# Patient Record
Sex: Male | Born: 1980 | Hispanic: Yes | Marital: Married | Smoking: Never smoker
Health system: Southern US, Community
[De-identification: ages and names within clinical notes are randomized; demographics above are authoritative.]

## PROBLEM LIST (undated history)

## (undated) DIAGNOSIS — I10 Essential (primary) hypertension: Secondary | ICD-10-CM

## (undated) DIAGNOSIS — E785 Hyperlipidemia, unspecified: Secondary | ICD-10-CM

## (undated) HISTORY — DX: Essential (primary) hypertension: I10

## (undated) HISTORY — DX: Hyperlipidemia, unspecified: E78.5

---

## 2014-02-11 ENCOUNTER — Emergency Department (HOSPITAL_COMMUNITY): Payer: Self-pay

## 2014-02-11 ENCOUNTER — Encounter (HOSPITAL_COMMUNITY): Payer: Self-pay | Admitting: Emergency Medicine

## 2014-02-11 ENCOUNTER — Emergency Department (HOSPITAL_COMMUNITY)
Admission: EM | Admit: 2014-02-11 | Discharge: 2014-02-11 | Disposition: A | Discharge status: Home / Self Care | Payer: Self-pay | Attending: Emergency Medicine | Admitting: Emergency Medicine

## 2014-02-11 DIAGNOSIS — R0789 Other chest pain: Secondary | ICD-10-CM | POA: Insufficient documentation

## 2014-02-11 DIAGNOSIS — I10 Essential (primary) hypertension: Secondary | ICD-10-CM | POA: Insufficient documentation

## 2014-02-11 DIAGNOSIS — R0602 Shortness of breath: Secondary | ICD-10-CM | POA: Insufficient documentation

## 2014-02-11 LAB — CBC
HEMATOCRIT: 42.3 % (ref 39.0–52.0)
Hemoglobin: 13.9 g/dL (ref 13.0–17.0)
MCH: 26.9 pg (ref 26.0–34.0)
MCHC: 32.9 g/dL (ref 30.0–36.0)
MCV: 82 fL (ref 78.0–100.0)
Platelets: 240 10*3/uL (ref 150–400)
RBC: 5.16 MIL/uL (ref 4.22–5.81)
RDW: 15.4 % (ref 11.5–15.5)
WBC: 6.1 10*3/uL (ref 4.0–10.5)

## 2014-02-11 LAB — COMPREHENSIVE METABOLIC PANEL
ALT: 27 U/L (ref 0–53)
ANION GAP: 12 (ref 5–15)
AST: 17 U/L (ref 0–37)
Albumin: 4.5 g/dL (ref 3.5–5.2)
Alkaline Phosphatase: 46 U/L (ref 39–117)
BILIRUBIN TOTAL: 0.4 mg/dL (ref 0.3–1.2)
BUN: 11 mg/dL (ref 6–23)
CHLORIDE: 99 meq/L (ref 96–112)
CO2: 27 meq/L (ref 19–32)
Calcium: 9.3 mg/dL (ref 8.4–10.5)
Creatinine, Ser: 0.91 mg/dL (ref 0.50–1.35)
Glucose, Bld: 87 mg/dL (ref 70–99)
Potassium: 4.6 mEq/L (ref 3.7–5.3)
Sodium: 138 mEq/L (ref 137–147)
Total Protein: 8.1 g/dL (ref 6.0–8.3)

## 2014-02-11 LAB — I-STAT TROPONIN, ED
Troponin i, poc: 0.05 ng/mL (ref 0.00–0.08)
Troponin i, poc: 0.04 ng/mL (ref 0.00–0.08)

## 2014-02-11 MED ORDER — PANTOPRAZOLE SODIUM 40 MG PO TBEC
40.0000 mg | DELAYED_RELEASE_TABLET | Freq: Every day | ORAL | Status: DC
  Administered 2014-02-11: 40 mg via ORAL
  Filled 2014-02-11: qty 1

## 2014-02-11 NOTE — ED Notes (Signed)
Pt monitored by pulse ox, bp cuff, and 12-lead. 

## 2014-02-11 NOTE — ED Notes (Signed)
Chest pain x 1 month and  Some sob  Pt speaks  Little english

## 2014-02-11 NOTE — ED Provider Notes (Signed)
CSN: [id number]     Arrival date & time 02/11/14  1358 History   First MD Initiated Contact with Patient 02/11/14 1619     Chief Complaint  Patient presents with  . Chest Pain  . Hypertension     (Consider location/radiation/quality/duration/timing/severity/associated sxs/prior Treatment) HPI Mort SawyersSalvador Derrell LollingRamirez is a 33 y.o. male presenting with mid chest pain intermittently for one month worse after eating. Not with deep breathing or activity. Pain does not radiate. Patient notes mild SOB with chest pain. Not associated with diaphoresis, nausea, vomiting.  Patient does not describe the pain as pressure, burning, sharp and cannot describe. Patient resolved. Patient found to be hypertensive to 169/107. Patient has never seen a PCP and unaware of hypercholesterolemia, DM. Patient does not have FH of sudden cardiac death. Patient does not smoke and is obese. No hemoptysis, recent surgery or trauma. No unilateral leg swelling or h/o DVT, PE.  Patient with friend in room who was translating with permission of patient.    History reviewed. No pertinent past medical history. History reviewed. No pertinent past surgical history. No family history on file. History  Substance Use Topics  . Smoking status: Never Smoker   . Smokeless tobacco: Not on file  . Alcohol Use: Yes    Review of Systems  Constitutional: Negative for fever and chills.  HENT: Negative for congestion and rhinorrhea.   Eyes: Negative for visual disturbance.  Respiratory: Positive for shortness of breath. Negative for cough.   Cardiovascular: Positive for chest pain. Negative for palpitations and leg swelling.  Gastrointestinal: Negative for nausea, vomiting and diarrhea.  Genitourinary: Negative for dysuria and hematuria.  Musculoskeletal: Negative for back pain and gait problem.  Skin: Negative for rash.  Neurological: Negative for weakness and headaches.      Allergies  Review of patient's allergies indicates no  known allergies.  Home Medications   Prior to Admission medications   Not on File   BP 134/76  Pulse 80  Temp(Src) 98 F (36.7 C) (Oral)  Resp 23  SpO2 97% Physical Exam  Nursing note and vitals reviewed. Constitutional: He appears well-developed and well-nourished. No distress.  HENT:  Head: Normocephalic and atraumatic.  Eyes: Conjunctivae and EOM are normal. Right eye exhibits no discharge. Left eye exhibits no discharge.  Cardiovascular: Normal rate, regular rhythm and normal heart sounds.   No leg swelling or tenderness. Negative Homan's sign.  Pulmonary/Chest: Effort normal and breath sounds normal. No respiratory distress. He has no wheezes.  Abdominal: Soft. Bowel sounds are normal. He exhibits no distension. There is no tenderness.  Musculoskeletal:  No midline back tenderness, step off or crepitus. No Right or Left sided lower back tenderness.   Neurological: He is alert. He exhibits normal muscle tone. Coordination normal.  Skin: Skin is warm and dry. He is not diaphoretic.    ED Course  Procedures (including critical care time) Labs Review Labs Reviewed  COMPREHENSIVE METABOLIC PANEL  CBC  I-STAT TROPOININ, ED  Rosezena SensorI-STAT TROPOININ, ED    Imaging Review Dg Chest 2 View  02/11/2014   CLINICAL DATA:  Chest pain.  EXAM: CHEST  2 VIEW  COMPARISON:  None.  FINDINGS: The heart size and mediastinal contours are within normal limits. Both lungs are clear. No pneumothorax or pleural effusion is noted. The visualized skeletal structures are unremarkable.  IMPRESSION: No acute cardiopulmonary abnormality seen.   Electronically Signed   By: Roque LiasJames  Green M.D.   On: 02/11/2014 15:33     EKG Interpretation  Date/Time:  Monday February 11 2014 14:19:23 EDT Ventricular Rate:  80 PR Interval:  150 QRS Duration: 94 QT Interval:  372 QTC Calculation: 429 R Axis:   1 Text Interpretation:  Normal sinus rhythm Moderate voltage criteria for  LVH, may be normal variant Inferior  infarct , age undetermined T wave  abnormality, consider lateral ischemia No previous ECGs available  Confirmed by [facility]  MD, Jonny Ruiz ([id number]) on 02/11/2014 5:37:39 PM      MDM   Final diagnoses:  Essential hypertension  Other chest pain   Chest pain is not likely of cardiac or pulmonary etiology d/t presentation, PERC negative, low risk HEART score. VSS, no JVD or new murmur, RRR, breath sounds equal bilaterally, EKG with t wave abnormalities and LVH, negative troponin and delta troponin, and negative CXR. Pt has been advised start a PPI and return to the ED if CP becomes exertional, associated with diaphoresis or nausea, radiates to left jaw/arm, worsens or becomes concerning in any way. Patient with hypertension.   Discussed that this most likely has been high for a while now. BP decreased in ED. Patient to follow up with cardiology due to his abnormal EKG. Patient without a PCP. Patient referred to the wellness center for further management of HTN. Patient to establish care and follow up. ED resources provided.   Discussed return precautions with patient. Discussed all results and patient verbalizes understanding and agrees with plan.  Case has been discussed with Dr. Fonnie Jarvis who agrees with the above plan and to discharge.     Louann Sjogren, PA-C 02/12/14 0110

## 2014-02-11 NOTE — Discharge Instructions (Signed)
Return to the emergency room with worsening of symptoms, new symptoms or with symptoms that are concerning, especially chest pain that feels like a pressure, spreads to left arm or jaw, worse with exertion, associated with nausea, vomiting, shortness of breath and/or sweating.  Start taking PPI. Over the counter omeprazole 20mg  once a day. Take one hour before meal. Call to make appointment with cardiology, heart doctor for further work up because of some abnormalities on your EKG, heart tracing. Call to make appointment with [facility]. Number above for management of your high blood pressure or Hypertension.

## 2014-02-11 NOTE — ED Notes (Signed)
Pt c/o pain when he eats especially pizza, rice, chicken cheese. SOB when he eats a lot.

## 2014-02-22 NOTE — ED Provider Notes (Signed)
Medical screening examination/treatment/procedure(s) were performed by non-physician practitioner and as supervising physician I was immediately available for consultation/collaboration.   EKG Interpretation   Date/Time:  Monday February 11 2014 14:19:23 EDT Ventricular Rate:  80 PR Interval:  150 QRS Duration: 94 QT Interval:  372 QTC Calculation: 429 R Axis:   1 Text Interpretation:  Normal sinus rhythm Moderate voltage criteria for  LVH, may be normal variant Inferior infarct , age undetermined T wave  abnormality, consider lateral ischemia No previous ECGs available  Confirmed by [facility]  MD, Jonny Ruiz ([id number]) on 02/11/2014 5:37:39 PM       Hurman Horn, MD 02/22/14 [phone]

## 2014-09-09 ENCOUNTER — Ambulatory Visit (INDEPENDENT_AMBULATORY_CARE_PROVIDER_SITE_OTHER): Payer: Self-pay | Admitting: Internal Medicine

## 2014-09-09 ENCOUNTER — Ambulatory Visit (INDEPENDENT_AMBULATORY_CARE_PROVIDER_SITE_OTHER): Payer: Self-pay

## 2014-09-09 VITALS — BP 160/100 | HR 75 | Temp 98.0°F | Resp 16 | Ht 68.5 in | Wt 240.0 lb

## 2014-09-09 DIAGNOSIS — M1000 Idiopathic gout, unspecified site: Secondary | ICD-10-CM

## 2014-09-09 DIAGNOSIS — M79671 Pain in right foot: Secondary | ICD-10-CM

## 2014-09-09 LAB — URIC ACID: Uric Acid, Serum: 7.4 mg/dL (ref 4.0–7.8)

## 2014-09-09 LAB — POCT CBC
Granulocyte percent: 66.5 %G (ref 37–80)
HEMATOCRIT: 40.4 % — AB (ref 43.5–53.7)
Hemoglobin: 13.1 g/dL — AB (ref 14.1–18.1)
LYMPH, POC: 1.5 (ref 0.6–3.4)
MCH, POC: 25.7 pg — AB (ref 27–31.2)
MCHC: 32.4 g/dL (ref 31.8–35.4)
MCV: 79.5 fL — AB (ref 80–97)
MID (cbc): 0.3 (ref 0–0.9)
MPV: 6.1 fL (ref 0–99.8)
POC Granulocyte: 3.6 (ref 2–6.9)
POC LYMPH PERCENT: 28 %L (ref 10–50)
POC MID %: 5.5 % (ref 0–12)
Platelet Count, POC: 318 10*3/uL (ref 142–424)
RBC: 5.09 M/uL (ref 4.69–6.13)
RDW, POC: 18 %
WBC: 5.4 10*3/uL (ref 4.6–10.2)

## 2014-09-09 LAB — POCT SEDIMENTATION RATE: POCT SED RATE: 24 mm/hr — AB (ref 0–22)

## 2014-09-09 MED ORDER — INDOMETHACIN 50 MG PO CAPS: 50.0000 mg | ORAL_CAPSULE | Freq: Three times a day (TID) | ORAL | Status: DC

## 2014-09-09 NOTE — Progress Notes (Signed)
   Subjective:    Patient ID: [name] [name], male    DOB: 27-Nov-1980, 34 y.o.   MRN: [id number]  HPI 2 days of right foot pain with no injury.No injury at work, No fever, No pain with moving only when walking. Pain mostly on top of foot  Review of Systems     Objective:   Physical Exam  Constitutional: He is oriented to person, place, and time. He appears well-developed and well-nourished. He appears distressed.  HENT:  Head: Normocephalic.  Mouth/Throat: Oropharynx is clear and moist.  Eyes: EOM are normal. No scleral icterus.  Neck: Normal range of motion.  Cardiovascular: Normal rate.   Pulmonary/Chest: Effort normal.  Musculoskeletal:       Right foot: There is tenderness and bony tenderness. There is normal range of motion, no swelling, normal capillary refill, no crepitus, no deformity and no laceration.       Feet:  Mild erythema  Neurological: He is alert and oriented to person, place, and time. He has normal strength. No sensory deficit. He exhibits normal muscle tone. Gait abnormal. Coordination normal.  Skin: No rash noted. There is erythema.  Psychiatric: He has a normal mood and affect.  Vitals reviewed.    [facility] reading (PRIMARY) by  Dr.Kalim Kissel normal foot xray Results for orders placed or performed in visit on 09/09/14  POCT CBC  Result Value Ref Range   WBC 5.4 4.6 - 10.2 K/uL   Lymph, poc 1.5 0.6 - 3.4   POC LYMPH PERCENT 28.0 10 - 50 %L   MID (cbc) 0.3 0 - 0.9   POC MID % 5.5 0 - 12 %M   POC Granulocyte 3.6 2 - 6.9   Granulocyte percent 66.5 37 - 80 %G   RBC 5.09 4.69 - 6.13 M/uL   Hemoglobin 13.1 (A) 14.1 - 18.1 g/dL   HCT, POC 89.1 (A) 69.4 - 53.7 %   MCV 79.5 (A) 80 - 97 fL   MCH, POC 25.7 (A) 27 - 31.2 pg   MCHC 32.4 31.8 - 35.4 g/dL   RDW, POC 50.3 %   Platelet Count, POC 318 142 - 424 K/uL   MPV 6.1 0 - 99.8 fL     Cbc/sed rate/uric acid       Assessment & Plan:  Probable acute gouty arthritis Camwalker/Indocin

## 2014-09-09 NOTE — Patient Instructions (Signed)
Gota  °(Gout) ° La gota es una artritis inflamatoria originada por la acumulación de cristales de ácido úrico en las articulaciones. El ácido úrico es una sustancia química que normalmente se encuentra en la sangre. Cuando los niveles de ácido úrico en la sangre son muy elevados, pueden formarse cristales que se depositan en las articulaciones y los tejidos. Esto causa irritación, dolor e hinchazón (inflamación). La repetición de los ataques es frecuente. Con el tiempo, los cristales de ácido úrico pueden formar masas (tofos) cerca de una articulación, destruyen el hueso y provocan una deformidad La gota es una enfermedad que puede tratarse y con frecuencia puede prevenirse. °CAUSAS  °La enfermedad comienza con niveles altos de ácido úrico en la sangre. El organismo produce ácido úrico cuando metaboliza una sustancia que se encuentra en estado natural, denominada purina. Ciertos alimentos, como carnes y pescado, contienen grandes cantidades de purinas. Las causas de ácido úrico elevado son:  °· Ser transmitida de padres a hijos (hereditaria). °· Enfermedades que causan un aumento de la producción de ácido úrico (como la obesidad, la psoriasis y ciertos tipos de cáncer). °· Abuso en el consumo de bebidas alcohólicas. °· La dieta, especialmente las dietas en las que se consume mucha carne y frutos de mar. °· Ciertos medicamentos, como los que combaten el cáncer (quimioterapia), los diuréticos y la aspirina. °· Enfermedades renales crónicas. Los riñones no pueden eliminar bien el ácido úrico. °· Problemas con el metabolismo. °Las enfermedades fuertemente asociadas a la gota son:  °· Obesidad. °· Hipertensión arterial. °· Colesterol elevado. °· Diabetes. °No todas las personas con niveles elevados de ácido úrico sufren gota. No se comprende aún por qué algunas personas padecen gota y otras no. Las cirugías, lesiones en una articulación y el consumo excesivo de ciertos alimentos son algunos de los factores que pueden  provocar ataques de gota.  °SÍNTOMAS  °· Un ataque de gota puede comenzar rápidamente. Causa un dolor intenso, con irritación, hinchazón y calor en una articulación. °· Puede haber fiebre. °· Generalmente sólo una articulación es afectada. Ciertas articulaciones se ven implicadas con más frecuencia. °¨ La base del dedo gordo del pie. °¨ La rodilla. °¨ El tobillo. °¨ La muñeca. °¨ Un dedo. °Sin tratamiento, un ataque por lo general desaparece en unos pocos días o semanas. Entre un ataque y otro, por lo general no hay síntomas, lo cual es diferente de muchas otras formas de artritis.  °DIAGNÓSTICO  °El profesional reunirá la información basándose en los síntomas y el examen físico. En algunos casos indicará estudios. Estos estudios pueden ser:  °· Análisis de sangre. °· Análisis de orina. °· Radiografías. °· Análisis de los fluidos de la articulación. En este estudio se necesita una aguja para extraer líquido de la articulación (artrocentesis). Con el uso del microscopio, se confirma la gota cuando se observan los cristales de ácido úrico en el líquido de la articulación. °TRATAMIENTO  °Hay dos fases en el tratamiento de la gota: el tratamiento del ataque de aparición repentina (agudo) y la prevención de los ataques (profilaxis).  °· Tratamiento de un ataque agudo. °¨ Se utilizan medicamentos. Se indican antiinflamatorios o corticoides. °¨ En algunos casos es necesario inyectar un corticoide en la articulación afectada. °¨ La articulación dolorosa se pone en reposo. El movimiento puede empeorar la artritis. °¨ Puede utilizar tanto tratamientos con calor o con frío para aliviar el dolor en las articulaciones, según lo que le haga mejor. °· Tratamiento para prevenir ataques. °¨ Si sufre de ataques de gota frecuentes,   el médico puede recomendarle medicamentos preventivos. Estos medicamentos se administran después de que el ataque agudo mejora. Estos medicamentos ayudan a los riñones a eliminar el ácido úrico del  organismo o a disminuir la producción de ácido úrico. Es posible que deba utilizar estos medicamentos durante un largo tiempo. °¨ La primera fase del tratamiento preventiva puede asociarse con un aumento de los ataques agudos de gota. Por esta razón, durante los primeros meses de tratamiento, su médico puede también aconsejarle que tome medicamentos que habitualmente se utilizan para el tratamiento de la gota aguda. Asegúrese de comprender todas las indicaciones del médico. El médico podrá hacer varios ajustes a la dosis del medicamento antes de que comiencen a ser efectivos. °¨ Comente el tratamiento dietético con su médico o nutricionista. El alcohol y las bebidas que contienen gran cantidad de azúcar y fructosa y los alimentos como la carne, el pollo y los frutos de mar pueden aumentar los niveles de ácido úrico. El médico o el nutricionista podrá aconsejarlo sobre las bebidas y los alimentos que debe limitar. °INSTRUCCIONES PARA EL CUIDADO EN EL HOGAR  °· No tome aspirina para el dolor. Esto eleva los niveles de ácido úrico. °· Sólo tome medicamentos de venta libre o prescriptos para calmar el dolor, las molestias o bajar la fiebre según las indicaciones de su médico. °· Haga reposo todo el tiempo que pueda. Cuando se encuentre en la cama, mantenga las sábanas y mantas alejadas de las articulaciones doloridas. °· Mantenga la articulación afectada levantada (elevada). °· Aplique compresas frías o calientes sobre las articulaciones doloridas. el uso de compresas calientes o frías depende de lo que mejor le resulte a usted. °· Utilice muletas si la articulación que le duele es de la pierna. °· Debe ingerir gran cantidad de líquido para mantener la orina de tono claro o color amarillo pálido. Esto ayudará a que el organismo elimine el ácido úrico. Limite el consumo de alcohol, bebidas azucaradas y que contengan fructosa. °· Siga las indicaciones con respecto a la dieta. Preste especial atención a la cantidad de  proteínas que consume. En la dieta diaria debe enfatizar el consumo de frutas, vegetales, granos enteros y productos lácteos descremados. Comente con su médico o nutricionista acerca del consumo de café, vitamina C o cerezas. Estos pueden ayudar a disminuir los niveles de ácido úrico. °· Mantenga un peso corporal adecuado. °SOLICITE ATENCIÓN MÉDICA SI:  °· Tiene diarrea, vómitos o algún efecto secundario provocado por los medicamentos. °· No se siente mejor en 24 horas, o empeora. °SOLICITE ATENCIÓN MÉDICA DE INMEDIATO SI:  °· Las articulaciones le duelen más de manera repentina, tiene escalofríos o fiebre. °ASEGÚRESE DE QUE:  °· Comprende estas instrucciones. °· Controlará su enfermedad. °· Solicitará ayuda de inmediato si no mejora o si empeora. °Document Released: 01/20/2005 Document Revised: 08/07/2012 °ExitCare® Patient Information ©2015 [employer]. This information is not intended to replace advice given to you by your health care provider. Make sure you discuss any questions you have with your health care provider. ° °

## 2014-09-10 ENCOUNTER — Encounter: Payer: Self-pay | Admitting: Family Medicine

## 2015-10-18 ENCOUNTER — Ambulatory Visit (INDEPENDENT_AMBULATORY_CARE_PROVIDER_SITE_OTHER): Payer: Self-pay | Admitting: Urgent Care

## 2015-10-18 VITALS — BP 120/84 | HR 80 | Temp 98.0°F | Resp 18 | Ht 68.5 in | Wt 221.0 lb

## 2015-10-18 DIAGNOSIS — M79671 Pain in right foot: Secondary | ICD-10-CM

## 2015-10-18 MED ORDER — NAPROXEN SODIUM 550 MG PO TABS: 550.0000 mg | ORAL_TABLET | Freq: Two times a day (BID) | ORAL | Status: DC

## 2015-10-18 NOTE — Patient Instructions (Addendum)
Espoln calcneo (Heel Spur) Los espolones calcneos son protuberancias seas que se forman en la parte inferior del hueso del taln (calcneo). Son frecuentes y no siempre Customer service manager. Sin embargo, a menudo Photographer en la banda de tejido resistente que se extiende por debajo del hueso del pie (fascia plantar). Cuando esto sucede, puede sentir [employer] parte inferior del pie, cerca del taln.  CAUSAS  No se conoce con exactitud la causa de los espolones calcneos. Pueden deberse a la presin ejercida sobre el taln, o bien originarse en las inserciones musculares (tendones) cercanas que traccionan en el taln.  FACTORES DE RIESGO Puede estar en riesgo de tener un espoln calcneo si:  Tiene ms de 40 aos.  Tiene sobrepeso.  Padece artritis por uso y desgaste (artrosis).  Presenta inflamacin en la fascia plantar. SIGNOS Y SNTOMAS  Algunas personas tienen espolones calcneos aunque no presentan sntomas. Si tiene sntomas, estos pueden incluir los siguientes:   Dolor en la parte inferior del taln.  Dolor que empeora al levantarse de la cama por primera vez.  Dolor que empeora despus de caminar o ponerse de pie. DIAGNSTICO  El mdico podr hacer el diagnstico del espoln calcneo en funcin de sus sntomas y del examen fsico. Tambin es posible que le realicen una radiografa del pie para detectar la presencia de una protuberancia sea que proviene del calcneo.  TRATAMIENTO El tratamiento pretende aliviar el dolor del espoln calcneo. Este puede incluir lo siguiente:  Ejercicios de Film/video editor.  Bajar de [place].  Usar calzado, plantillas o aparatos ortopdicos especficos para mayor comodidad y apoyo.  Usar frulas a la noche para Secretary/administrator en una posicin apropiada.  Tomar medicamentos de venta libre para Engineer, materials.  Recibir tratamiento con ondas sonoras de alta intensidad para fragmentar el espoln (terapia extracorprea por ondas de  choque).  Recibir inyecciones de corticoides en el taln para reducir la hinchazn y Engineer, materials.  Someterse a Transport planner calcneo causa dolor a Air cabin crew (crnico). INSTRUCCIONES PARA EL CUIDADO EN EL HOGAR   Tome los medicamentos solamente como se lo haya indicado el mdico.  Pregunte a su mdico si debe aplicar hielo o compresas fras en las zonas dolorosas del taln o del pie.  Evite las actividades que puedan causarle dolor hasta que se recupere o como se lo haya indicado el mdico.  [place] antes de hacer ejercicio o actividad fsica.  Use zapatos con un buen apoyo que le calcen bien como le haya indicado su mdico. Es posible que necesite comprar zapatos nuevos. Si [place] zapatos viejos o que no le calzan bien no obtendr el apoyo que necesita.  Baje de peso si su mdico considera que debera [place]. Esto podra aliviar la presin en el pie que puede estar causando el dolor y las [place]. SOLICITE ATENCIN MDICA SI:   El dolor contina o empeora.   Esta informacin no tiene Theme park manager el consejo del mdico. Asegrese de hacerle al mdico cualquier pregunta que tenga.   Document Released: 01/27/2006 Document Revised: 05/03/2014 Elsevier Interactive Patient Education [employer]     IF you received an x-ray today, you will receive an invoice from [facility]. Please contact [facility] at [phone] with questions or concerns regarding your invoice.   IF you received labwork today, you will receive an invoice from [employer]. Please contact [facility] at [phone] with questions or concerns regarding your invoice.   Our billing staff will  not be able to assist you with questions regarding bills from these companies.  You will be contacted with the lab results as soon as they are available. The fastest way to get your results is to activate your My Chart account. Instructions are located on the  last page of this paperwork. If you have not heard from [place] regarding the results in 2 weeks, please contact this office.

## 2015-10-18 NOTE — Progress Notes (Signed)
    MRN: [id number] DOB: [birth year]  Subjective:   [name] [name] is a 35 y.o. male presenting for chief complaint of Foot Pain  Reports 3 day history of right foot pain. Pain is mostly over his heel, lateral foot, some swelling. Wears boots at work, drives a Merchant navy officer. Drove last Friday all day but his pain started 3 days ago. Has tried ibuprofen with some relief. Denies fever, redness, trauma, numbness or tingling, history of foot injuries.  [name] [name] has a current medication list which includes the following prescription(s): lisinopril-hydrochlorothiazide and indomethacin. Also has No Known Allergies.  [name] [name]  has no past medical history on file. Also  has no past surgical history on file.  Objective:   Vitals: BP 120/84 mmHg  Pulse 80  Temp(Src) 98 F (36.7 C) (Oral)  Resp 18  Ht 5' 8.5" (1.74 m)  Wt 221 lb (100.245 kg)  BMI 33.11 kg/m2  SpO2 98%  Physical Exam  Constitutional: He is oriented to person, place, and time. He appears well-developed and well-nourished.  Cardiovascular: Normal rate.   Pulmonary/Chest: Effort normal.  Musculoskeletal:       Right foot: There is tenderness (over area depicted) and bony tenderness. There is normal range of motion, no swelling, normal capillary refill, no crepitus, no deformity and no laceration.       Feet:  Neurological: He is alert and oriented to person, place, and time.  Skin: Skin is warm and dry.   Assessment and Plan :   1. Heel pain, right - Will manage conservatively with Anaprox twice daily, wear heel gel pads. Consider foot x-ray and additional work up if no improvement in 2 weeks. For now, patient declined this due to cost burden and being self-pay.  [name] Bamberg, PA-C [facility] [phone] 10/18/2015 11:46 AM

## 2017-02-10 ENCOUNTER — Encounter (HOSPITAL_COMMUNITY): Payer: Self-pay

## 2017-02-10 ENCOUNTER — Ambulatory Visit (INDEPENDENT_AMBULATORY_CARE_PROVIDER_SITE_OTHER): Payer: Self-pay | Admitting: Family Medicine

## 2017-02-10 ENCOUNTER — Emergency Department (HOSPITAL_COMMUNITY): Payer: Self-pay

## 2017-02-10 ENCOUNTER — Encounter: Payer: Self-pay | Admitting: Family Medicine

## 2017-02-10 ENCOUNTER — Encounter (HOSPITAL_COMMUNITY)
Admission: EM | Disposition: A | Discharge status: Home / Self Care | Payer: Self-pay | Source: Home / Self Care | Attending: Cardiothoracic Surgery

## 2017-02-10 ENCOUNTER — Inpatient Hospital Stay (HOSPITAL_COMMUNITY)
Admission: EM | Admit: 2017-02-10 | Discharge: 2017-02-21 | DRG: 233 | Disposition: A | Discharge status: Home / Self Care | Payer: Self-pay | Attending: Cardiothoracic Surgery | Admitting: Cardiothoracic Surgery

## 2017-02-10 VITALS — BP 158/100 | HR 102 | Temp 98.1°F | Resp 18 | Ht 69.0 in | Wt 221.6 lb

## 2017-02-10 DIAGNOSIS — K219 Gastro-esophageal reflux disease without esophagitis: Secondary | ICD-10-CM | POA: Diagnosis present

## 2017-02-10 DIAGNOSIS — Z951 Presence of aortocoronary bypass graft: Secondary | ICD-10-CM

## 2017-02-10 DIAGNOSIS — R079 Chest pain, unspecified: Secondary | ICD-10-CM

## 2017-02-10 DIAGNOSIS — I110 Hypertensive heart disease with heart failure: Secondary | ICD-10-CM | POA: Diagnosis present

## 2017-02-10 DIAGNOSIS — I5021 Acute systolic (congestive) heart failure: Secondary | ICD-10-CM | POA: Diagnosis not present

## 2017-02-10 DIAGNOSIS — Z6832 Body mass index (BMI) 32.0-32.9, adult: Secondary | ICD-10-CM

## 2017-02-10 DIAGNOSIS — I214 Non-ST elevation (NSTEMI) myocardial infarction: Principal | ICD-10-CM | POA: Diagnosis present

## 2017-02-10 DIAGNOSIS — R12 Heartburn: Secondary | ICD-10-CM

## 2017-02-10 DIAGNOSIS — I10 Essential (primary) hypertension: Secondary | ICD-10-CM | POA: Insufficient documentation

## 2017-02-10 DIAGNOSIS — R570 Cardiogenic shock: Secondary | ICD-10-CM | POA: Diagnosis not present

## 2017-02-10 DIAGNOSIS — Z8249 Family history of ischemic heart disease and other diseases of the circulatory system: Secondary | ICD-10-CM

## 2017-02-10 DIAGNOSIS — D62 Acute posthemorrhagic anemia: Secondary | ICD-10-CM | POA: Diagnosis not present

## 2017-02-10 DIAGNOSIS — I255 Ischemic cardiomyopathy: Secondary | ICD-10-CM | POA: Diagnosis present

## 2017-02-10 DIAGNOSIS — Z9114 Patient's other noncompliance with medication regimen: Secondary | ICD-10-CM

## 2017-02-10 DIAGNOSIS — B354 Tinea corporis: Secondary | ICD-10-CM

## 2017-02-10 DIAGNOSIS — I2510 Atherosclerotic heart disease of native coronary artery without angina pectoris: Secondary | ICD-10-CM | POA: Diagnosis present

## 2017-02-10 DIAGNOSIS — E785 Hyperlipidemia, unspecified: Secondary | ICD-10-CM | POA: Diagnosis present

## 2017-02-10 DIAGNOSIS — I493 Ventricular premature depolarization: Secondary | ICD-10-CM | POA: Diagnosis not present

## 2017-02-10 DIAGNOSIS — E876 Hypokalemia: Secondary | ICD-10-CM | POA: Diagnosis present

## 2017-02-10 DIAGNOSIS — I517 Cardiomegaly: Secondary | ICD-10-CM | POA: Insufficient documentation

## 2017-02-10 HISTORY — PX: LEFT HEART CATH AND CORONARY ANGIOGRAPHY: CATH118249

## 2017-02-10 LAB — COMPREHENSIVE METABOLIC PANEL
ALT: 38 U/L (ref 17–63)
ANION GAP: 9 (ref 5–15)
AST: 40 U/L (ref 15–41)
Albumin: 3.4 g/dL — ABNORMAL LOW (ref 3.5–5.0)
Alkaline Phosphatase: 42 U/L (ref 38–126)
BUN: 10 mg/dL (ref 6–20)
CO2: 22 mmol/L (ref 22–32)
CREATININE: 0.98 mg/dL (ref 0.61–1.24)
Calcium: 7.3 mg/dL — ABNORMAL LOW (ref 8.9–10.3)
Chloride: 110 mmol/L (ref 101–111)
GFR calc non Af Amer: >60 mL/min (ref >60)
Glucose, Bld: 90 mg/dL (ref 65–99)
Potassium: 2.8 mmol/L — ABNORMAL LOW (ref 3.5–5.1)
Sodium: 141 mmol/L (ref 135–145)
Total Bilirubin: 0.6 mg/dL (ref 0.3–1.2)
Total Protein: 5.7 g/dL — ABNORMAL LOW (ref 6.5–8.1)

## 2017-02-10 LAB — CBC WITH DIFFERENTIAL/PLATELET
Basophils Absolute: 0 10*3/uL (ref 0.0–0.1)
Basophils Relative: 1 %
Eosinophils Absolute: 0.1 10*3/uL (ref 0.0–0.7)
Eosinophils Relative: 2 %
HCT: 36.9 % — ABNORMAL LOW (ref 39.0–52.0)
Hemoglobin: 11.4 g/dL — ABNORMAL LOW (ref 13.0–17.0)
LYMPHS ABS: 1.3 10*3/uL (ref 0.7–4.0)
Lymphocytes Relative: 23 %
MCH: 24.6 pg — AB (ref 26.0–34.0)
MCHC: 30.9 g/dL (ref 30.0–36.0)
MCV: 79.5 fL (ref 78.0–100.0)
Monocytes Absolute: 0.5 10*3/uL (ref 0.1–1.0)
Monocytes Relative: 9 %
Neutro Abs: 3.8 10*3/uL (ref 1.7–7.7)
Neutrophils Relative %: 65 %
Platelets: 246 10*3/uL (ref 150–400)
RBC: 4.64 MIL/uL (ref 4.22–5.81)
RDW: 16.8 % — ABNORMAL HIGH (ref 11.5–15.5)
WBC: 5.7 10*3/uL (ref 4.0–10.5)

## 2017-02-10 LAB — BASIC METABOLIC PANEL
ANION GAP: 7 (ref 5–15)
BUN: 10 mg/dL (ref 6–20)
CHLORIDE: 105 mmol/L (ref 101–111)
CO2: 26 mmol/L (ref 22–32)
Calcium: 8.7 mg/dL — ABNORMAL LOW (ref 8.9–10.3)
Creatinine, Ser: 1.06 mg/dL (ref 0.61–1.24)
Glucose, Bld: 94 mg/dL (ref 65–99)
POTASSIUM: 4.4 mmol/L (ref 3.5–5.1)
SODIUM: 138 mmol/L (ref 135–145)

## 2017-02-10 LAB — TROPONIN I: Troponin I: 1.77 ng/mL (ref <0.03)

## 2017-02-10 LAB — TYPE AND SCREEN
ABO/RH(D): O POS
ANTIBODY SCREEN: NEGATIVE

## 2017-02-10 LAB — MAGNESIUM: Magnesium: 2.1 mg/dL (ref 1.7–2.4)

## 2017-02-10 SURGERY — LEFT HEART CATH AND CORONARY ANGIOGRAPHY
Anesthesia: LOCAL

## 2017-02-10 MED ORDER — LIDOCAINE HCL 2 % IJ SOLN
INTRAMUSCULAR | Status: DC | PRN
  Administered 2017-02-10: 1 mL

## 2017-02-10 MED ORDER — ASPIRIN 81 MG PO CHEW: 81.0000 mg | CHEWABLE_TABLET | ORAL | Status: DC

## 2017-02-10 MED ORDER — ASPIRIN EC 81 MG PO TBEC
81.0000 mg | DELAYED_RELEASE_TABLET | Freq: Every day | ORAL | Status: DC
  Administered 2017-02-11 – 2017-02-14 (×4): 81 mg via ORAL
  Filled 2017-02-10 (×4): qty 1

## 2017-02-10 MED ORDER — VERAPAMIL HCL 2.5 MG/ML IV SOLN
INTRAVENOUS | Status: AC
  Filled 2017-02-10: qty 2

## 2017-02-10 MED ORDER — HEPARIN SODIUM (PORCINE) 1000 UNIT/ML IJ SOLN
INTRAMUSCULAR | Status: DC | PRN
  Administered 2017-02-10: 5000 [IU] via INTRAVENOUS

## 2017-02-10 MED ORDER — HEPARIN (PORCINE) IN NACL 100-0.45 UNIT/ML-% IJ SOLN
1200.0000 [IU]/h | INTRAMUSCULAR | Status: DC
  Administered 2017-02-10: 1200 [IU]/h via INTRAVENOUS
  Filled 2017-02-10: qty 250

## 2017-02-10 MED ORDER — ASPIRIN 81 MG PO CHEW
324.0000 mg | CHEWABLE_TABLET | ORAL | Status: AC
  Administered 2017-02-10: 324 mg via ORAL

## 2017-02-10 MED ORDER — ASPIRIN 300 MG RE SUPP: 300.0000 mg | RECTAL | Status: AC

## 2017-02-10 MED ORDER — HEPARIN SODIUM (PORCINE) 1000 UNIT/ML IJ SOLN
INTRAMUSCULAR | Status: AC
  Filled 2017-02-10: qty 1

## 2017-02-10 MED ORDER — LIDOCAINE HCL 2 % IJ SOLN
INTRAMUSCULAR | Status: AC
  Filled 2017-02-10: qty 10

## 2017-02-10 MED ORDER — NITROGLYCERIN IN D5W 200-5 MCG/ML-% IV SOLN
0.0000 ug/min | INTRAVENOUS | Status: DC
  Filled 2017-02-10: qty 250

## 2017-02-10 MED ORDER — SODIUM CHLORIDE 0.9 % WEIGHT BASED INFUSION: 3.0000 mL/kg/h | INTRAVENOUS | Status: DC

## 2017-02-10 MED ORDER — SODIUM CHLORIDE 0.9% FLUSH
3.0000 mL | Freq: Two times a day (BID) | INTRAVENOUS | Status: DC
  Administered 2017-02-10 – 2017-02-14 (×7): 3 mL via INTRAVENOUS

## 2017-02-10 MED ORDER — POTASSIUM CHLORIDE CRYS ER 20 MEQ PO TBCR
EXTENDED_RELEASE_TABLET | ORAL | Status: AC
Start: 2017-02-10 — End: 2017-02-10
  Filled 2017-02-10: qty 2

## 2017-02-10 MED ORDER — SODIUM CHLORIDE 0.9% FLUSH: 3.0000 mL | INTRAVENOUS | Status: DC | PRN

## 2017-02-10 MED ORDER — POTASSIUM CHLORIDE CRYS ER 20 MEQ PO TBCR
40.0000 meq | EXTENDED_RELEASE_TABLET | Freq: Two times a day (BID) | ORAL | Status: AC
Start: 2017-02-10 — End: 2017-02-12
  Administered 2017-02-10 – 2017-02-12 (×4): 40 meq via ORAL
  Filled 2017-02-10 (×4): qty 2

## 2017-02-10 MED ORDER — MIDAZOLAM HCL 2 MG/2ML IJ SOLN
INTRAMUSCULAR | Status: AC
  Filled 2017-02-10: qty 2

## 2017-02-10 MED ORDER — ATORVASTATIN CALCIUM 80 MG PO TABS
80.0000 mg | ORAL_TABLET | Freq: Every day | ORAL | Status: DC
  Administered 2017-02-11 – 2017-02-20 (×11): 80 mg via ORAL
  Filled 2017-02-10 (×12): qty 1

## 2017-02-10 MED ORDER — ACETAMINOPHEN 325 MG PO TABS: 650.0000 mg | ORAL_TABLET | ORAL | Status: DC | PRN

## 2017-02-10 MED ORDER — ONDANSETRON HCL 4 MG/2ML IJ SOLN: 4.0000 mg | Freq: Four times a day (QID) | INTRAMUSCULAR | Status: DC | PRN

## 2017-02-10 MED ORDER — OMEPRAZOLE 20 MG PO CPDR: 20.0000 mg | DELAYED_RELEASE_CAPSULE | Freq: Two times a day (BID) | ORAL | 2 refills | Status: DC

## 2017-02-10 MED ORDER — FENTANYL CITRATE (PF) 100 MCG/2ML IJ SOLN
INTRAMUSCULAR | Status: AC
  Filled 2017-02-10: qty 2

## 2017-02-10 MED ORDER — IOPAMIDOL (ISOVUE-370) INJECTION 76%
INTRAVENOUS | Status: AC
Start: 2017-02-10 — End: 2017-02-10
  Filled 2017-02-10: qty 100

## 2017-02-10 MED ORDER — SODIUM CHLORIDE 0.9 % IV SOLN: 250.0000 mL | INTRAVENOUS | Status: DC | PRN

## 2017-02-10 MED ORDER — SODIUM CHLORIDE 0.9 % IV SOLN
INTRAVENOUS | Status: DC
  Administered 2017-02-13: 05:00:00 via INTRAVENOUS

## 2017-02-10 MED ORDER — HEPARIN (PORCINE) IN NACL 100-0.45 UNIT/ML-% IJ SOLN: 1200.0000 [IU]/h | INTRAMUSCULAR | Status: DC

## 2017-02-10 MED ORDER — VERAPAMIL HCL 2.5 MG/ML IV SOLN
INTRAVENOUS | Status: DC | PRN
  Administered 2017-02-10: 10 mL via INTRA_ARTERIAL

## 2017-02-10 MED ORDER — FENTANYL CITRATE (PF) 100 MCG/2ML IJ SOLN
INTRAMUSCULAR | Status: DC | PRN
Start: 2017-02-10 — End: 2017-02-10
  Administered 2017-02-10: 50 ug via INTRAVENOUS

## 2017-02-10 MED ORDER — IOPAMIDOL (ISOVUE-370) INJECTION 76%
INTRAVENOUS | Status: DC | PRN
  Administered 2017-02-10: 105 mL via INTRA_ARTERIAL

## 2017-02-10 MED ORDER — METOPROLOL TARTRATE 50 MG PO TABS
50.0000 mg | ORAL_TABLET | Freq: Two times a day (BID) | ORAL | Status: DC
  Administered 2017-02-10 – 2017-02-13 (×6): 50 mg via ORAL
  Filled 2017-02-10 (×6): qty 1

## 2017-02-10 MED ORDER — AMLODIPINE BESYLATE 5 MG PO TABS: 5.0000 mg | ORAL_TABLET | Freq: Every day | ORAL | 2 refills | Status: DC

## 2017-02-10 MED ORDER — HEPARIN (PORCINE) IN NACL 2-0.9 UNIT/ML-% IJ SOLN
INTRAMUSCULAR | Status: AC
  Filled 2017-02-10: qty 1000

## 2017-02-10 MED ORDER — SODIUM CHLORIDE 0.9 % WEIGHT BASED INFUSION: 1.0000 mL/kg/h | INTRAVENOUS | Status: DC

## 2017-02-10 MED ORDER — NITROGLYCERIN 0.4 MG SL SUBL: 0.4000 mg | SUBLINGUAL_TABLET | SUBLINGUAL | Status: DC | PRN

## 2017-02-10 MED ORDER — SODIUM CHLORIDE 0.9% FLUSH
3.0000 mL | INTRAVENOUS | Status: DC | PRN
  Administered 2017-02-13: 3 mL via INTRAVENOUS
  Filled 2017-02-10: qty 3

## 2017-02-10 MED ORDER — POTASSIUM CHLORIDE CRYS ER 10 MEQ PO TBCR
EXTENDED_RELEASE_TABLET | ORAL | Status: DC | PRN
  Administered 2017-02-10: 40 meq via ORAL

## 2017-02-10 MED ORDER — SODIUM CHLORIDE 0.9% FLUSH: 3.0000 mL | Freq: Two times a day (BID) | INTRAVENOUS | Status: DC

## 2017-02-10 MED ORDER — HEPARIN (PORCINE) IN NACL 2-0.9 UNIT/ML-% IJ SOLN
INTRAMUSCULAR | Status: AC | PRN
  Administered 2017-02-10: 1000 mL

## 2017-02-10 MED ORDER — CLOTRIMAZOLE 1 % EX CREA: 1.0000 "application " | TOPICAL_CREAM | Freq: Two times a day (BID) | CUTANEOUS | 0 refills | Status: DC

## 2017-02-10 MED ORDER — IOPAMIDOL (ISOVUE-370) INJECTION 76%
INTRAVENOUS | Status: AC
  Filled 2017-02-10: qty 50

## 2017-02-10 MED ORDER — HEPARIN BOLUS VIA INFUSION
4000.0000 [IU] | Freq: Once | INTRAVENOUS | Status: AC
  Administered 2017-02-10: 4000 [IU] via INTRAVENOUS
  Filled 2017-02-10: qty 4000

## 2017-02-10 MED ORDER — LABETALOL HCL 100 MG PO TABS: 100.0000 mg | ORAL_TABLET | Freq: Two times a day (BID) | ORAL | 2 refills | Status: DC

## 2017-02-10 MED ORDER — MIDAZOLAM HCL 2 MG/2ML IJ SOLN
INTRAMUSCULAR | Status: DC | PRN
  Administered 2017-02-10 (×2): 1 mg via INTRAVENOUS

## 2017-02-10 SURGICAL SUPPLY — 14 items
CATH IMPULSE 5F ANG/FL3.5 (CATHETERS) ×2 IMPLANT
CATH INFINITI 5 FR MPA2 (CATHETERS) ×2 IMPLANT
CATH INFINITI 5FR AL1 (CATHETERS) ×2 IMPLANT
CATH LAUNCHER 5F EBU3.0 (CATHETERS) ×1 IMPLANT
CATHETER LAUNCHER 5F EBU3.0 (CATHETERS) ×2
DEVICE RAD COMP TR BAND LRG (VASCULAR PRODUCTS) ×2 IMPLANT
GLIDESHEATH SLEND SS 6F .021 (SHEATH) ×2 IMPLANT
GUIDEWIRE INQWIRE 1.5J.035X260 (WIRE) ×1 IMPLANT
INQWIRE 1.5J .035X260CM (WIRE) ×2
KIT HEART LEFT (KITS) ×2 IMPLANT
PACK CARDIAC CATHETERIZATION (CUSTOM PROCEDURE TRAY) ×2 IMPLANT
SYR MEDRAD MARK V 150ML (SYRINGE) ×2 IMPLANT
TRANSDUCER W/STOPCOCK (MISCELLANEOUS) ×2 IMPLANT
TUBING CIL FLEX 10 FLL-RA (TUBING) ×2 IMPLANT

## 2017-02-10 NOTE — Interval H&P Note (Signed)
History and Physical Interval Note:  02/10/2017 6:42 PM  [name] [name]  has presented today for cardiac catheterization, with the diagnosis of NSTEMI. The various methods of treatment have been discussed with the patient and family. After consideration of risks, benefits and other options for treatment, the patient has consented to  Procedure(s): LEFT HEART CATH AND CORONARY ANGIOGRAPHY (N/A) as a surgical intervention .  The patient's history has been reviewed, patient examined, no change in status, stable for surgery.  I have reviewed the patient's chart and labs.  Questions were answered to the patient's satisfaction.    Cath Lab Visit (complete for each Cath Lab visit)  Clinical Evaluation Leading to the Procedure:   ACS: Yes.    Non-ACS:  N/A  Sylvana Bonk

## 2017-02-10 NOTE — Patient Instructions (Addendum)
IF you received an x-ray today, you will receive an invoice from [facility]. Please contact [facility] at [phone] with questions or concerns regarding your invoice.   IF you received labwork today, you will receive an invoice from [facility]. Please contact [facility] at [phone] with questions or concerns regarding your invoice.   Our billing staff will not be able to assist you with questions regarding bills from these companies.  You will be contacted with the lab results as soon as they are available. The fastest way to get your results is to activate your My Chart account. Instructions are located on the last page of this paperwork. If you have not heard from us regarding the results in 2 weeks, please contact this office.     Angina de pecho (Angina Pectoris) La angina de pecho es una sensacin de molestia extrema en el pecho, el cuello o el brazo. El mdico podr llamarla simplemente angina. Hay cuatro tipos de angina de pecho. La angina es causada por la falta de sangre en la capa media y ms gruesa de la pared del corazn (miocardio). Se puede sentir como un dolor que aplasta o retuerce en el pecho. Se puede sentir Insurance account managercomo un dolor u opresin fuerte en el pecho. Algunas personas dicen que se siente como empacho, acidez estomacal o gases. Algunas personas pueden tener sntomas diferentes al [employer]. Estos incluyen los siguientes:  Falta de [place].  Sudor fro.  Ganas de vomitar (nuseas).  Sensacin de desvanecimiento. Muchas mujeres tienen WPS Resourcesmolestias en el pecho y algunos de los otros sntomas. Sin embargo, a Research scientist (medical)menudo presentan sntomas diferentes, como:  Sensacin de Merchant navy officercansancio (fatiga).  Nerviosismo o preocupacin sin causa aparente.  Debilidad sin causa aparente.  Mareos o Newell Rubbermaiddesmayos. Las mujeres pueden tener angina sin presentar sntomas. CUIDADOS EN EL HOGAR  Tome los medicamentos solamente como se lo haya indicado el mdico.  Cudese de otras  afecciones como se lo haya indicado su mdico. Estos incluyen los siguientes: ? Presin arterial elevada (hipertensin). ? Diabetes.  Siga una dieta cardiosaludable. El mdico puede ayudarle a elegir opciones de alimentos saludables y a Forensic psychologistrealizar cambios.  Consulte a su mdico para conocer ms sobre mtodos de coccin saludables y selos. Estos incluyen los siguientes: ? Asar. ? Grillar. ? Hervir. ? Hornear. ? Escalfar. ? Cocer al vapor. ? Saltear.  Siga un programa de ejercicios autorizado por su mdico.  Mantenga un peso saludable. Pierda peso como se lo haya indicado su mdico.  Descanse cuando se sienta cansado.  Aprenda a Dealermanejar el estrs.  No consuma ningn producto que contenga tabaco, como cigarrillos, tabaco de Theatre managermascar o Administrator, Civil Servicecigarrillos electrnicos. Si necesita ayuda para dejar de fumar, consulte al mdico.  Si bebe alcohol y su mdico lo autoriza, limtese a no ms de 1 vaso por da. Una medida equivale a 12onzas de cerveza, 5onzas de vino o 1onzas de bebidas alcohlicas de alta graduacin.  No consuma drogas.  Concurra a todas las visitas de control como se lo haya indicado el mdico. Esto es importante. No tome los siguientes medicamentos a menos que el mdico lo autorice:  Antiinflamatorios no esteroides (AINE), como: ? Ibuprofeno. ? Naproxeno. ? Celecoxib.  Suplementos vitamnicos que contienen vitamina A, vitamina E o ambas.  Tratamientos de reposicin hormonal que contienen estrgeno con o sin progestina. SOLICITE AYUDA DE INMEDIATO SI:  Tiene dolor en el pecho, el cuello, el brazo, la Quanahmand[place]ula, el estmago o la espalda que: ? Dura ms de unos minutos. ? Se  repite. ? No mejora despus de tomar medicamentos sublinguales (nitroglicerina sublingual).  Tiene alguno de estos sntomas sin ningn motivo: ? Gases, Merchant navy officer, empacho. ? Sudoracin abundante. ? Falta de aire o dificultad para respirar. ? Malestar estomacal y vmitos. ? Sentirse ms  cansado que de [place]. ? Sentirse nervioso o ms preocupado que lo habitual. ? Sensacin de debilidad. ? Diarrea.  Se siente mareado o sufre un desmayo de forma repentina.  Se desmaya o pierde el conocimiento. Estos sntomas pueden Customer service manager. No espere hasta que los sntomas desaparezcan. Solicite atencin mdica de inmediato. Comunquese con el servicio de emergencias de su localidad (911 en los [place]). No conduzca por sus propios medios [employer]. Esta informacin no tiene Theme park manager el consejo del mdico. Asegrese de hacerle al mdico cualquier pregunta que tenga. Document Released: 03/29/2012 Document Revised: 05/03/2014 Document Reviewed: 08/14/2013 Elsevier Interactive Patient Education  2017 [employer].

## 2017-02-10 NOTE — Progress Notes (Signed)
ANTICOAGULATION CONSULT NOTE - Initial Consult  Pharmacy Consult for heparin dosing Indication: chest pain/ACS  No Known Allergies  Patient Measurements: Height: 5\' 9"  (175.3 cm) Weight: 221 lb (100.2 kg) IBW/kg (Calculated) : 70.7 Heparin Dosing Weight: 91.9kg  Vital Signs: Temp: 99.3 F (37.4 C) (10/18 1540) Temp Source: Oral (10/18 1540) BP: 171/107 (10/18 1540) Pulse Rate: 92 (10/18 1627)  Labs:  Recent Labs  02/10/17 1130 02/10/17 1545  HGB  --  11.4*  HCT  --  36.9*  PLT  --  246  TROPONINI 10.67*  --     CrCl cannot be calculated (Patient's most recent lab result is older than the maximum 21 days allowed.).   Medical History: Past Medical History:  Diagnosis Date  . Essential hypertension, benign     Medications:  Scheduled:   Assessment: 36 YOM admitted with intermittent chest pain over 2 weeks and abnormal lab (troponin at PCP 10.67).  Hgb 11.4, Hct 36.9, Plt 246. No anticoag PTA. No bleeding reported. EKG interior infarct  Goal of Therapy:  Heparin level 0.3-0.7 units/ml Monitor platelets by anticoagulation protocol: Yes   Plan:  Give 4000 units bolus x 1 Start heparin infusion at 1200 units/hr  6 hour heparin level, daily heparin level and CBC Monitor s/sx of bleeding.   Emeline General, PharmD Candidate 02/10/2017,4:30 PM

## 2017-02-10 NOTE — ED Notes (Signed)
Pt brought to holding area in cath lab

## 2017-02-10 NOTE — ED Triage Notes (Signed)
Pt arrived via [facility] c/o abnormal lab resulted at [facility] office.  Pt states chest pain intermittent over the last 2 weeks. [place] called 911 to pts house after calling to advise abnormal lab.  EMS gave 324mg  ASA.  No chest pain on arrival

## 2017-02-10 NOTE — Progress Notes (Signed)
10/18/20189:37 AM  [name] [name] [birth year], 36 y.o. male [id number]  Chief Complaint  Patient presents with  . Annual Exam    HPI:   Patient is a 36 y.o. male with past medical history significant for HTN who presents today for annual exam with specific concerns.  2 weeks of SOB and feeling "heart gets accelerated" after he eats and with exertion. Unable to do any moderate lifting or vigorous work wo getting symptoms. Last about 5 minutes. Denies chest pain/pressure. Denies any nausea, but becomes diaphoretic. Elevating his arms helps. He does not smoke. His mother had unknown type of heart surgery in her 30s, but she died in her 97s. He does endorse heartburn with spicy foods, this feels different.  He has been off his BP meds for over 2 months, was feeling fine and did not like that he had to urinate so much.    Depression screen [facility] 2/9 02/10/2017 10/18/2015  Decreased Interest 0 0  Down, Depressed, Hopeless 0 0  PHQ - 2 Score 0 0    No Known Allergies  Prior to Admission medications   Medication Sig Start Date End Date Taking? Authorizing Provider  lisinopril-hydrochlorothiazide (PRINZIDE,ZESTORETIC) 20-25 MG per tablet Take 1 tablet by mouth daily.    [provider]    Past Medical History:  Diagnosis Date  . Essential hypertension, benign     History reviewed. No pertinent surgical history.  Social History  Substance Use Topics  . Smoking status: Never Smoker  . Smokeless tobacco: Never Used  . Alcohol use Yes    Family History  Problem Relation Age of Onset  . Diabetes Sister   . Diabetes Brother     Review of Systems  Constitutional: Negative for chills and fever.  HENT: Negative for congestion, ear pain and sore throat.   Eyes: Negative for blurred vision.  Respiratory: Positive for shortness of breath. Negative for cough and wheezing.   Cardiovascular: Positive for palpitations. Negative for chest pain, orthopnea, claudication  and leg swelling.  Gastrointestinal: Positive for constipation and heartburn. Negative for abdominal pain, blood in stool, diarrhea, melena, nausea and vomiting.  Genitourinary: Negative for dysuria and hematuria.  Neurological: Positive for headaches. Negative for sensory change, speech change and focal weakness.     OBJECTIVE:  Blood pressure (!) 158/100, pulse (!) 102, temperature 98.1 F (36.7 C), temperature source Oral, resp. rate 18, height 5\' 9"  (1.753 m), weight 221 lb 9.6 oz (100.5 kg), SpO2 98 %.  Physical Exam  Constitutional: He is oriented to person, place, and time and well-developed, well-nourished, and in no distress.  HENT:  Head: Normocephalic and atraumatic.  Right Ear: Hearing, tympanic membrane, external ear and ear canal normal.  Left Ear: Hearing, tympanic membrane, external ear and ear canal normal.  Mouth/Throat: Oropharynx is clear and moist. No oropharyngeal exudate.  Eyes: Pupils are equal, round, and reactive to light. Conjunctivae and EOM are normal.  Neck: Neck supple. No thyromegaly present.  Cardiovascular: Normal rate, regular rhythm, normal heart sounds and intact distal pulses.  Exam reveals no gallop and no friction rub.   No murmur heard. Pulmonary/Chest: Effort normal and breath sounds normal. He has no wheezes. He has no rales.  Abdominal: Soft. Bowel sounds are normal. He exhibits no distension and no mass. There is no hepatosplenomegaly. There is tenderness in the right upper quadrant and epigastric area. There is no rebound, no guarding and negative Murphy's sign.  Musculoskeletal: He exhibits no edema.  Lymphadenopathy:  He has no cervical adenopathy.  Neurological: He is alert and oriented to person, place, and time. He has normal reflexes. Gait normal.  Skin: Skin is warm and dry. Rash (moist erythematous patches along groin line) noted.  Psychiatric: Mood and affect normal.    EKG: sinus, HR 84, LVH w repolarization, cant exclude  ischemia  ASSESSMENT and PLAN 1. Chest pain, unspecified type Discussed patient with cards, ekg reviewed, no acute findings. Symptoms most likely related to uncontrolled HTN w component of GERD. Workup and meds as recommended, referral placed. Strict ER precautions given.  - EKG 12-Lead - CBC with Differential - Comprehensive metabolic panel - Lipid panel - TSH - Troponin I - Ambulatory referral to Cardiology  2. Essential hypertension, benign - amLODipine (NORVASC) 5 MG tablet; Take 1 tablet (5 mg total) by mouth daily. - labetalol (NORMODYNE) 100 MG tablet; Take 1 tablet (100 mg total) by mouth 2 (two) times daily. - CBC with Differential - Comprehensive metabolic panel - Lipid panel - TSH - Ambulatory referral to Cardiology  3. Heartburn - omeprazole (PRILOSEC) 20 MG capsule; Take 1 capsule (20 mg total) by mouth 2 (two) times daily before a meal.   4. Tinea corporis - clotrimazole (LOTRIMIN) 1 % cream; Apply 1 application topically 2 (two) times daily.   5. LVH (left ventricular hypertrophy) - Ambulatory referral to Cardiology   Return in about 2 weeks (around 02/24/2017).    Myles LippsIrma M Santiago, MD [facility] [address] [place], [place] [address] Ph.  [phone] Fax [phone]

## 2017-02-10 NOTE — Progress Notes (Signed)
ANTICOAGULATION CONSULT NOTE - Follow Up Consult  Pharmacy Consult for heparin Indication: CAD awaiting CABG consult  Labs:  Recent Labs  02/10/17 1130 02/10/17 1252 02/10/17 1545 02/10/17 2138  HGB  --  13.5 11.4*  --   HCT  --  41.5 36.9*  --   PLT  --  226 246  --   CREATININE  --  1.00 0.98 1.06  TROPONINI 10.67*  --  1.77*  --     Assessment: 36yo male now s/p cath that revealed severe 3-vessel CAD, to resume heparin two hours after TR-band removal (@2224  per RN).  Goal of Therapy:  Heparin level 0.3-0.7 units/ml Monitor platelets by anticoagulation protocol: Yes   Plan:  Will restart heparin gtt at 1200 units/hr at 0030 and monitor heparin levels and CBC.  Vernard Gambles, PharmD, BCPS  02/10/2017,11:43 PM

## 2017-02-10 NOTE — ED Provider Notes (Signed)
[facility] EMERGENCY DEPARTMENT Provider Note   CSN: [id number] Arrival date & time: 02/10/17  1539     History   Chief Complaint Chief Complaint  Patient presents with  . Abnormal Lab    HPI [name] [name] is a 36 y.o. male.  The history is provided by the patient.  Shortness of Breath  This is a new problem. The average episode lasts 2 weeks. The problem occurs intermittently.The current episode started more than 1 week ago. The problem has been gradually worsening. Associated symptoms include chest pain. Pertinent negatives include no fever, no rhinorrhea, no cough, no sputum production, no syncope, no leg pain and no leg swelling. It is unknown what precipitated the problem. He has tried nothing for the symptoms. The treatment provided no relief.   36 year old male who presents with shortness of breath and chest pain. He has had 2 weeks of intermittent chest pressure and shortness of breath, primarily with daily activities. States that he has been off work during this time due to his symptoms. At times becomes very diaphoretic. Denies any nausea, vomiting, diarrhea, abdominal pain, orthopnea, PND, lower extremity edema, or calf tenderness. States that his mother has had heart attack before the past but he isn't sure at what age. He denies any personal or family history of PE/DVT, recent immobilization, or traveling.  He had routine primary care doctor's appointment today at the moment. He was noted to have an abnormal EKG. On review of the chart they did speak with cardiology. He had outpatient blood work performed and had a troponin of 10. He was called back to come to the ED for evaluation. Currently denies chest pain   Past Medical History:  Diagnosis Date  . Essential hypertension, benign     Patient Active Problem List   Diagnosis Date Noted  . Essential hypertension, benign 02/10/2017  . LVH (left ventricular hypertrophy) 02/10/2017     History reviewed. No pertinent surgical history.     Home Medications    Prior to Admission medications   Medication Sig Start Date End Date Taking? Authorizing Provider  naproxen sodium (ANAPROX) 220 MG tablet Take 660 mg by mouth daily as needed.   Yes [provider]  amLODipine (NORVASC) 5 MG tablet Take 1 tablet (5 mg total) by mouth daily. 02/10/17   Myles Lipps, MD  clotrimazole (LOTRIMIN) 1 % cream Apply 1 application topically 2 (two) times daily. 02/10/17   Myles Lipps, MD  labetalol (NORMODYNE) 100 MG tablet Take 1 tablet (100 mg total) by mouth 2 (two) times daily. 02/10/17   Myles Lipps, MD  omeprazole (PRILOSEC) 20 MG capsule Take 1 capsule (20 mg total) by mouth 2 (two) times daily before a meal. 02/10/17   Myles Lipps, MD    Family History Family History  Problem Relation Age of Onset  . Diabetes Sister   . Diabetes Brother     Social History Social History  Substance Use Topics  . Smoking status: Never Smoker  . Smokeless tobacco: Never Used  . Alcohol use Yes     Allergies   Patient has no known allergies.   Review of Systems Review of Systems  Constitutional: Negative for fever.  HENT: Negative for rhinorrhea.   Respiratory: Positive for shortness of breath. Negative for cough and sputum production.   Cardiovascular: Positive for chest pain. Negative for leg swelling and syncope.  All other systems reviewed and are negative.    Physical  Exam Updated Vital Signs BP (!) 158/98 (BP Location: Left Arm)   Pulse 89   Temp 99.3 F (37.4 C) (Oral)   Resp (!) 26   Ht 5\' 9"  (1.753 m)   Wt 100.2 kg (221 lb)   SpO2 98%   BMI 32.64 kg/m   Physical Exam Physical Exam  Nursing note and vitals reviewed. Constitutional: Well developed, well nourished, non-toxic, and in no acute distress Head: Normocephalic and atraumatic.  Mouth/Throat: Oropharynx is clear and moist.  Neck: Normal range of motion. Neck supple.   Cardiovascular: Normal rate and regular rhythm.   Pulmonary/Chest: Effort normal and breath sounds normal.  Abdominal: Soft. There is no tenderness. There is no rebound and no guarding.  Musculoskeletal: Normal range of motion.  Neurological: Alert, no facial droop, fluent speech, moves all extremities symmetrically Skin: Skin is warm and dry.  Psychiatric: Cooperative   ED Treatments / Results  Labs (all labs ordered are listed, but only abnormal results are displayed) Labs Reviewed  CBC WITH DIFFERENTIAL/PLATELET - Abnormal; Notable for the following:       Result Value   Hemoglobin 11.4 (*)    HCT 36.9 (*)    MCH 24.6 (*)    RDW 16.8 (*)    All other components within normal limits  COMPREHENSIVE METABOLIC PANEL - Abnormal; Notable for the following:    Potassium 2.8 (*)    Calcium 7.3 (*)    Total Protein 5.7 (*)    Albumin 3.4 (*)    All other components within normal limits  TROPONIN I - Abnormal; Notable for the following:    Troponin I 1.77 (*)    All other components within normal limits  HEPARIN LEVEL (UNFRACTIONATED)    EKG  EKG Interpretation  Date/Time:  Thursday February 10 2017 15:41:57 EDT Ventricular Rate:  95 PR Interval:    QRS Duration: 81 QT Interval:  345 QTC Calculation: 434 R Axis:   -10 Text Interpretation:  Sinus rhythm Probable left atrial enlargement LVH with secondary repolarization abnormality Inferior infarct, age indeterminate Probable anterior infarct, age indeterminate Confirmed by Crista CurbLiu, Verena Shawgo [phone]([id number]) on 02/10/2017 4:03:09 PM       Radiology Dg Chest Port 1 View  Result Date: 02/10/2017 CLINICAL DATA:  Chest pain EXAM: PORTABLE CHEST 1 VIEW COMPARISON:  Chest radiograph 02/11/2014 FINDINGS: Shallow lung inflation. There is cardiomegaly without pulmonary edema. No focal airspace consolidation, pleural effusion or pneumothorax. Normal visualized osseous structures. IMPRESSION: Cardiomegaly and shallow lung inflation without focal  airspace disease. Electronically Signed   By: Deatra RobinsonKevin  Herman M.D.   On: 02/10/2017 17:15    Procedures Procedures (including critical care time) CRITICAL CARE Performed by: Lavera Guiseana Duo Sadye Kiernan   Total critical care time: 35 minutes  Critical care time was exclusive of separately billable procedures and treating other patients.  Critical care was necessary to treat or prevent imminent or life-threatening deterioration.  Critical care was time spent personally by me on the following activities: development of treatment plan with patient and/or surrogate as well as nursing, discussions with consultants, evaluation of patient's response to treatment, examination of patient, obtaining history from patient or surrogate, ordering and performing treatments and interventions, ordering and review of laboratory studies, ordering and review of radiographic studies, pulse oximetry and re-evaluation of patient's condition.  Medications Ordered in ED Medications  heparin bolus via infusion 4,000 Units (not administered)  heparin ADULT infusion 100 units/mL (25000 units/28450mL sodium chloride 0.45%) (not administered)  potassium chloride SA (K-DUR,KLOR-CON) CR tablet 40  mEq (not administered)     Initial Impression / Assessment and Plan / ED Course  I have reviewed the triage vital signs and the nursing notes.  Pertinent labs & imaging results that were available during my care of the patient were reviewed by me and considered in my medical decision making (see chart for details).     Presents with 2 weeks exertional chest pain and shortness of breath. With elevated troponin of 10 prior to arrival and EKG changes showing ST depression in inferior and lateral leads. Presentation c/f NSTEMI. Received ASA prior to arrival. He is chest pain free. Started on heparin drip. Cardiology consulted for admission.   Final Clinical Impressions(s) / ED Diagnoses   Final diagnoses:  NSTEMI (non-ST elevated myocardial  infarction) [facility])    New Prescriptions New Prescriptions   No medications on file     Lavera Guise, MD 02/10/17 [phone]

## 2017-02-10 NOTE — H&P (Signed)
Cardiology Consultation:   Patient ID: Anthuan Tenny; [id number]; December 02, 1980   Admit date: 02/10/2017 Date of Consult: 02/10/2017  Primary Care Provider: Patient, No Pcp Per Primary Cardiologist: New Primary Electrophysiologist: None   Patient Profile:   Cederick Newmann is a 36 y.o. male with a hx of hypertension, GERD who is being seen today for the evaluation of non-ST elevation myocardial infarction at the request of Dr Verdie Mosher.  History of Present Illness:   Mr. Desautel is a 36 year old male with hypertension, GERD who earlier today went to [place] urgent care for an annual exam and noted that he was having approximately 2 weeks of shortness of breath and the sensation of "heart getting accelerated "after he eats and with exertion.  His symptoms are worsened by trying to lift objects or perform vigorous work.  Usually the duration is a few minutes then subsides.  He becomes diaphoretic.  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  He did not have any chest pain here in the emergency room.  He states that his last episode of chest pain was yesterday and severe.  He said that it lasted 2 minutes duration.  He is a non-smoker.  He states that he drinks maybe 6 beers over the weekend.  His mother died in her 3s but had a congenital heart surgery in her 30s.  He does have GERD with spicy foods but currently the symptoms are different.  He has not been taking his hypertension medications.  Non-smoker.  His blood pressure when he went to the urgent care was elevated at 158/100 with a pulse of 102.  Original EKG at 9:41 AM shows sinus rhythm with LVH and J-point elevation in V1 and V2 as  well as diffuse ST segment depression and T wave inversions which could be ischemia superimposed upon repolarization abnormalities.  His original troponin checked at 1130 was 10, repeat here in the emergency room was 1.7.  Potassium is currently 2.8.  Past Medical History:  Diagnosis Date  . Essential hypertension, benign     History reviewed. No pertinent surgical history.   Home Medications:  Prior to Admission medications   Medication Sig Start Date End Date Taking? Authorizing Provider  naproxen sodium (ANAPROX) 220 MG tablet Take 660 mg by mouth daily as needed.   Yes [provider]  amLODipine (NORVASC) 5 MG tablet Take 1 tablet (5 mg total) by mouth daily. 02/10/17   Myles Lipps, MD  clotrimazole (LOTRIMIN) 1 % cream Apply 1 application topically 2 (two) times daily. 02/10/17   Myles Lipps, MD  labetalol (NORMODYNE) 100 MG tablet Take 1 tablet (100 mg total) by mouth 2 (two) times daily. 02/10/17   Myles Lipps, MD  omeprazole (PRILOSEC) 20 MG capsule Take 1 capsule (20 mg total) by mouth 2 (two) times daily before a meal. 02/10/17   Myles Lipps, MD    Inpatient Medications: Scheduled Meds: . atorvastatin  80 mg Oral q1800  . metoprolol tartrate  50 mg Oral BID  . potassium chloride  40 mEq Oral BID   Continuous Infusions: . heparin 1,200 Units/hr (02/10/17 1752)   PRN Meds:   Allergies:   No Known Allergies  Social  History:   Social History   Social History  . Marital status: Married    Spouse name: N/A  . Number of children: N/A  . Years of education: N/A   Occupational History  . Not on file.   Social History Main Topics  . Smoking status: Never Smoker  . Smokeless tobacco: Never Used  . Alcohol use Yes  . Drug use: No  . Sexual activity: Not on file   Other Topics Concern  . Not on file   Social History Narrative  . No narrative on file    Family History:    Family History  Problem Relation Age of Onset  .  Diabetes Sister   . Diabetes Brother      ROS:  Please see the history of present illness.  Denies any syncope, bleeding, orthopnea, PND ROS  All other ROS reviewed and negative.     Physical Exam/Data:   Vitals:   02/10/17 1627 02/10/17 1630 02/10/17 1700 02/10/17 1756  BP:  (!) 158/98 (!) 150/99 (!) 154/93  Pulse: 92 90 87 90  Resp: 14 (!) 24 (!) 25 (!) 22  Temp:      TempSrc:      SpO2: 97% 98% 97% 98%  Weight:      Height:       No intake or output data in the 24 hours ending 02/10/17 1800 Filed Weights   02/10/17 1541  Weight: 221 lb (100.2 kg)   Body mass index is 32.64 kg/m.  General:  Well nourished, well developed, in no acute distress HEENT: normal Lymph: no adenopathy Neck: no JVD Endocrine:  No thryomegaly Vascular: No carotid bruits; FA pulses 2+ bilaterally, normal radial pulses Cardiac:  normal S1, S2; RRR; no murmur  Lungs:  clear to auscultation bilaterally, no wheezing, rhonchi or rales  Abd: soft, nontender, no hepatomegaly  Ext: no edema Musculoskeletal:  No deformities, BUE and BLE strength normal and equal Skin: warm and dry  Neuro:  CNs 2-12 intact, no focal abnormalities noted Psych:  Normal affect   EKG:  The EKG was personally reviewed and demonstrates: As described above with LVH, repolarization/ischemic changes diffusely with J-point elevation in V1 and V2  Telemetry:  Telemetry was personally reviewed and demonstrates: Normal sinus rhythm  Relevant CV Studies:  Pending  Laboratory Data:  Chemistry  Recent Labs Lab 02/10/17 1545  NA 141  K 2.8*  CL 110  CO2 22  GLUCOSE 90  BUN 10  CREATININE 0.98  CALCIUM 7.3*  GFRNONAA >60  GFRAA >60  ANIONGAP 9     Recent Labs Lab 02/10/17 1545  PROT 5.7*  ALBUMIN 3.4*  AST 40  ALT 38  ALKPHOS 42  BILITOT 0.6   Hematology  Recent Labs Lab 02/10/17 1545  WBC 5.7  RBC 4.64  HGB 11.4*  HCT 36.9*  MCV 79.5  MCH 24.6*  MCHC 30.9  RDW 16.8*  PLT 246   Cardiac  Enzymes  Recent Labs Lab 02/10/17 1130 02/10/17 1545  TROPONINI 10.67* 1.77*   No results for input(s): TROPIPOC in the last 168 hours.  BNPNo results for input(s): BNP, PROBNP in the last 168 hours.  DDimer No results for input(s): DDIMER in the last 168 hours.  Radiology/Studies:  Dg Chest Port 1 View  Result Date: 02/10/2017 CLINICAL DATA:  Chest pain EXAM: PORTABLE CHEST 1 VIEW COMPARISON:  Chest radiograph 02/11/2014 FINDINGS: Shallow lung inflation. There is cardiomegaly without pulmonary edema. No focal airspace consolidation, pleural effusion or  pneumothorax. Normal visualized osseous structures. IMPRESSION: Cardiomegaly and shallow lung inflation without focal airspace disease. Electronically Signed   By: Deatra RobinsonKevin  Herman M.D.   On: 02/10/2017 17:15    Assessment and Plan:   36 year old male with hypertension uncontrolled and non-ST elevation myocardial infarction with what sounds like escalating unstable anginal symptoms over the last several days, original troponin 10.6, troponin in the ER 1.7  Non-ST elevation myocardial infarction - Heparin IV has been started.  Aspirin, beta-blocker, statin. - Invasive management with cardiac catheterization.  I discussed with him via translator risks of cardiac catheterization including stroke, heart Attack, death, renal impairment, bleeding.  His wife was present.  He is willing to proceed. -Concerning ongoing ischemic changes on ECG with diffuse ST segment depression and J-point ST elevation in V1 concerning for possible triple-vessel disease.  Despite his negative chest pain at the moment, I am concerned that he may become unstable and therefore we will bring him to the cardiac catheterization lab for further evaluation. -Cardiomegaly noted on chest x-ray, I wonder if he could potentially have a hypertensive cardiomyopathy?  Essential hypertension/uncontrolled -Continue to titrate antihypertensives.  Low potassium, think of  hyperaldosteronism on differential.  Hypokalemia -I have ordered him potassium 40 mEq to be taken orally now and again today as well as 2 more doses tomorrow.  Lipid panel pending.  Echocardiogram ordered.  For questions or updates, please contact [facility] Please consult [email or web address] for contact info under Cardiology/STEMI.   Signed, Donato SchultzMark Skains, MD  02/10/2017 6:00 PM

## 2017-02-11 ENCOUNTER — Inpatient Hospital Stay (HOSPITAL_COMMUNITY): Payer: Self-pay

## 2017-02-11 ENCOUNTER — Encounter (HOSPITAL_COMMUNITY): Payer: Self-pay | Admitting: Internal Medicine

## 2017-02-11 DIAGNOSIS — I25110 Atherosclerotic heart disease of native coronary artery with unstable angina pectoris: Secondary | ICD-10-CM

## 2017-02-11 DIAGNOSIS — I10 Essential (primary) hypertension: Secondary | ICD-10-CM

## 2017-02-11 LAB — COMPREHENSIVE METABOLIC PANEL
ALT: 50 IU/L — ABNORMAL HIGH (ref 0–44)
AST: 58 IU/L — ABNORMAL HIGH (ref 0–40)
Albumin/Globulin Ratio: 1.8 (ref 1.2–2.2)
Albumin: 5 g/dL (ref 3.5–5.5)
Alkaline Phosphatase: 54 IU/L (ref 39–117)
BUN/Creatinine Ratio: 12 (ref 9–20)
BUN: 12 mg/dL (ref 6–20)
Bilirubin Total: 0.7 mg/dL (ref 0.0–1.2)
CO2: 23 mmol/L (ref 20–29)
Calcium: 9.7 mg/dL (ref 8.7–10.2)
Chloride: 102 mmol/L (ref 96–106)
Creatinine, Ser: 1 mg/dL (ref 0.76–1.27)
GFR calc Af Amer: 111 mL/min/{1.73_m2} (ref >59)
GFR calc non Af Amer: 96 mL/min/{1.73_m2} (ref >59)
Globulin, Total: 2.8 g/dL (ref 1.5–4.5)
Glucose: 90 mg/dL (ref 65–99)
Potassium: 4.8 mmol/L (ref 3.5–5.2)
Sodium: 142 mmol/L (ref 134–144)
Total Protein: 7.8 g/dL (ref 6.0–8.5)

## 2017-02-11 LAB — CBC
HCT: 39.6 % (ref 39.0–52.0)
Hemoglobin: 12.2 g/dL — ABNORMAL LOW (ref 13.0–17.0)
MCH: 24.6 pg — AB (ref 26.0–34.0)
MCHC: 30.8 g/dL (ref 30.0–36.0)
MCV: 80 fL (ref 78.0–100.0)
PLATELETS: 239 10*3/uL (ref 150–400)
RBC: 4.95 MIL/uL (ref 4.22–5.81)
RDW: 17.1 % — ABNORMAL HIGH (ref 11.5–15.5)
WBC: 7.1 10*3/uL (ref 4.0–10.5)

## 2017-02-11 LAB — SURGICAL PCR SCREEN
MRSA, PCR: NEGATIVE
Staphylococcus aureus: POSITIVE — AB

## 2017-02-11 LAB — POCT I-STAT 3, ART BLOOD GAS (G3+)
ACID-BASE EXCESS: 1 mmol/L (ref 0.0–2.0)
Bicarbonate: 25.3 mmol/L (ref 20.0–28.0)
O2 Saturation: 97 %
TCO2: 26 mmol/L (ref 22–32)
pCO2 arterial: 37.8 mmHg (ref 32.0–48.0)
pH, Arterial: 7.433 (ref 7.350–7.450)
pO2, Arterial: 92 mmHg (ref 83.0–108.0)

## 2017-02-11 LAB — PULMONARY FUNCTION TEST
FEF 25-75 Pre: 5.27 L/sec
FEF2575-%Pred-Pre: 140 %
FEV1-%Pred-Pre: 105 %
FEV1-Pre: 4 L
FEV1FVC-%Pred-Pre: 111 %
FEV6-%Pred-Pre: 97 %
FEV6-Pre: 4.49 L
FEV6FVC-%Pred-Pre: 102 %
FVC-%Pred-Pre: 95 %
FVC-Pre: 4.5 L
Pre FEV1/FVC ratio: 89 %
Pre FEV6/FVC Ratio: 100 %

## 2017-02-11 LAB — CBC WITH DIFFERENTIAL/PLATELET
Basophils Absolute: 0.1 10*3/uL (ref 0.0–0.2)
Basos: 1 %
EOS (ABSOLUTE): 0.1 10*3/uL (ref 0.0–0.4)
Eos: 2 %
Hematocrit: 41.5 % (ref 37.5–51.0)
Hemoglobin: 13.5 g/dL (ref 13.0–17.7)
Immature Grans (Abs): 0 10*3/uL (ref 0.0–0.1)
Immature Granulocytes: 0 %
Lymphocytes Absolute: 1.4 10*3/uL (ref 0.7–3.1)
Lymphs: 21 %
MCH: 25.1 pg — ABNORMAL LOW (ref 26.6–33.0)
MCHC: 32.5 g/dL (ref 31.5–35.7)
MCV: 77 fL — ABNORMAL LOW (ref 79–97)
Monocytes Absolute: 0.4 10*3/uL (ref 0.1–0.9)
Monocytes: 6 %
Neutrophils Absolute: 4.7 10*3/uL (ref 1.4–7.0)
Neutrophils: 70 %
Platelets: 226 10*3/uL (ref 150–379)
RBC: 5.38 x10E6/uL (ref 4.14–5.80)
RDW: 17.7 % — ABNORMAL HIGH (ref 12.3–15.4)
WBC: 6.7 10*3/uL (ref 3.4–10.8)

## 2017-02-11 LAB — URINALYSIS, ROUTINE W REFLEX MICROSCOPIC
Bilirubin Urine: NEGATIVE
Glucose, UA: NEGATIVE mg/dL
Hgb urine dipstick: NEGATIVE
Ketones, ur: NEGATIVE mg/dL
Leukocytes, UA: NEGATIVE
Nitrite: NEGATIVE
Protein, ur: NEGATIVE mg/dL
Specific Gravity, Urine: 1.019 (ref 1.005–1.030)
pH: 6 (ref 5.0–8.0)

## 2017-02-11 LAB — LIPID PANEL
Chol/HDL Ratio: 6.1 ratio — ABNORMAL HIGH (ref 0.0–5.0)
Cholesterol, Total: 233 mg/dL — ABNORMAL HIGH (ref 100–199)
HDL: 38 mg/dL — ABNORMAL LOW (ref >39)
LDL Calculated: 153 mg/dL — ABNORMAL HIGH (ref 0–99)
Triglycerides: 209 mg/dL — ABNORMAL HIGH (ref 0–149)
VLDL Cholesterol Cal: 42 mg/dL — ABNORMAL HIGH (ref 5–40)

## 2017-02-11 LAB — BASIC METABOLIC PANEL
ANION GAP: 7 (ref 5–15)
BUN: 9 mg/dL (ref 6–20)
CALCIUM: 8.8 mg/dL — AB (ref 8.9–10.3)
CHLORIDE: 105 mmol/L (ref 101–111)
CO2: 27 mmol/L (ref 22–32)
Creatinine, Ser: 1 mg/dL (ref 0.61–1.24)
GFR calc non Af Amer: >60 mL/min (ref >60)
Glucose, Bld: 100 mg/dL — ABNORMAL HIGH (ref 65–99)
POTASSIUM: 4.4 mmol/L (ref 3.5–5.1)
Sodium: 139 mmol/L (ref 135–145)

## 2017-02-11 LAB — TSH: TSH: 4.77 u[IU]/mL — ABNORMAL HIGH (ref 0.450–4.500)

## 2017-02-11 LAB — HEPARIN LEVEL (UNFRACTIONATED)
HEPARIN UNFRACTIONATED: 0.31 [IU]/mL (ref 0.30–0.70)
Heparin Unfractionated: <0.1 IU/mL — ABNORMAL LOW (ref 0.30–0.70)

## 2017-02-11 LAB — MRSA PCR SCREENING: MRSA by PCR: NEGATIVE

## 2017-02-11 LAB — PROTIME-INR
INR: 1.03
Prothrombin Time: 13.4 seconds (ref 11.4–15.2)

## 2017-02-11 LAB — HIV ANTIBODY (ROUTINE TESTING W REFLEX): HIV Screen 4th Generation wRfx: NONREACTIVE

## 2017-02-11 LAB — ABO/RH: ABO/RH(D): O POS

## 2017-02-11 MED ORDER — MUPIROCIN 2 % EX OINT
1.0000 "application " | TOPICAL_OINTMENT | Freq: Two times a day (BID) | CUTANEOUS | Status: AC
  Administered 2017-02-11 – 2017-02-16 (×9): 1 via NASAL
  Filled 2017-02-11 (×2): qty 22

## 2017-02-11 MED ORDER — HEPARIN (PORCINE) IN NACL 100-0.45 UNIT/ML-% IJ SOLN
1550.0000 [IU]/h | INTRAMUSCULAR | Status: DC
  Administered 2017-02-11: 1500 [IU]/h via INTRAVENOUS
  Administered 2017-02-12 – 2017-02-15 (×5): 1550 [IU]/h via INTRAVENOUS
  Filled 2017-02-11 (×6): qty 250

## 2017-02-11 MED ORDER — HEPARIN BOLUS VIA INFUSION
2700.0000 [IU] | Freq: Once | INTRAVENOUS | Status: AC
  Administered 2017-02-11: 2700 [IU] via INTRAVENOUS
  Filled 2017-02-11: qty 2700

## 2017-02-11 MED ORDER — CHLORHEXIDINE GLUCONATE CLOTH 2 % EX PADS
6.0000 | MEDICATED_PAD | Freq: Every day | CUTANEOUS | Status: DC
Start: 2017-02-11 — End: 2017-02-15
  Administered 2017-02-12 – 2017-02-14 (×2): 6 via TOPICAL

## 2017-02-11 NOTE — Plan of Care (Signed)
Problem: Cardiovascular: Goal: Vascular access site(s) Level 0-1 will be maintained Outcome: Progressing TR band deflated and removed.radial site level 0.

## 2017-02-11 NOTE — Progress Notes (Signed)
ANTICOAGULATION CONSULT NOTE - Follow Up Consult  Pharmacy Consult for heparin dosing Indication: CAD awaiting CABG consult   No Known Allergies  Patient Measurements: Height: 5\' 9"  (175.3 cm) Weight: 220 lb 6.4 oz (100 kg) IBW/kg (Calculated) : 70.7 Heparin Dosing Weight: 91.9kg  Vital Signs: Temp: 97.4 F (36.3 C) (10/19 1924) Temp Source: Oral (10/19 1924) BP: 130/88 (10/19 2000) Pulse Rate: 79 (10/19 2000)  Labs:  Recent Labs  02/10/17 1130  02/10/17 1252 02/10/17 1545 02/10/17 2138 02/11/17 0528 02/11/17 0852 02/11/17 1907  HGB  --   --  13.5 11.4*  --  12.2*  --   --   HCT  --   --  41.5 36.9*  --  39.6  --   --   PLT  --   --  226 246  --  239  --   --   LABPROT  --   --   --   --   --  13.4  --   --   INR  --   --   --   --   --  1.03  --   --   HEPARINUNFRC  --   --   --   --   --   --  <0.10* 0.31  CREATININE  --   < > 1.00 0.98 1.06 1.00  --   --   TROPONINI 10.67*  --   --  1.77*  --   --   --   --   < > = values in this interval not displayed.  Estimated Creatinine Clearance: 117.9 mL/min (by C-G formula based on SCr of 1 mg/dL).   Medical History: Past Medical History:  Diagnosis Date  . Essential hypertension, benign    Assessment:  36 yom found to have severe 3-vessel CAD- now awaiting CTVS evaluation. Heparin was resumed two hours after TR band removal on 10/19@0100 . HL this evening is therapeutic and on low end of goal range.   Goal of Therapy:  Heparin level 0.3-0.7 units/ml Monitor platelets by anticoagulation protocol: Yes   Plan:  Increase heparin infusion to 1550 units/hr  Obtain 6 confirmatory hour heparin level  Monitor daily heparin level, CBC, and s/sx of bleeding.   Vinnie Level, PharmD., BCPS Clinical Pharmacist Pager [phone]

## 2017-02-11 NOTE — Care Management Note (Signed)
Case Management Note  Patient Details  Name: Kaceson Benally MRN: [id number] Date of Birth: August 19, 1980  Subjective/Objective:    From home with family, presents with chest pain, s/p heart cath shows severe 3 vessel dz, for surgery consult for CABG, for possible CABG on Tues 10/23. No PCP , no insurance on file.  He may need medication ast at discharge.                  Action/Plan: NCM will follow for dc needs.   Expected Discharge Date:                  Expected Discharge Plan:     In-House Referral:     Discharge planning Services  CM Consult  Post Acute Care Choice:    Choice offered to:     DME Arranged:    DME Agency:     HH Arranged:    HH Agency:     Status of Service:  In process, will continue to follow  If discussed at Long Length of Stay Meetings, dates discussed:    Additional Comments:  Leone Haven, RN 02/11/2017, 6:15 PM

## 2017-02-11 NOTE — Progress Notes (Signed)
Progress Note  Patient Name: [name] [name] Date of Encounter: 02/11/2017  Primary Cardiologist: Anne Fu  Subjective   No chest pain overnight, no shortness of breath.  Wife present in room.  Inpatient Medications    Scheduled Meds: . aspirin EC  81 mg Oral Daily  . atorvastatin  80 mg Oral q1800  . metoprolol tartrate  50 mg Oral BID  . potassium chloride  40 mEq Oral BID  . sodium chloride flush  3 mL Intravenous Q12H   Continuous Infusions: . sodium chloride 10 mL/hr at 02/11/17 0900  . sodium chloride    . heparin 1,200 Units/hr (02/11/17 0900)  . nitroGLYCERIN     PRN Meds: sodium chloride, acetaminophen, nitroGLYCERIN, ondansetron (ZOFRAN) IV, sodium chloride flush   Vital Signs    Vitals:   02/11/17 0700 02/11/17 0736 02/11/17 0800 02/11/17 0900  BP: 119/89  131/87 134/84  Pulse: 76  80 72  Resp: 17  18 16   Temp:  99 F (37.2 C)    TempSrc:  Oral    SpO2: 98%  98% 99%  Weight:      Height:        Intake/Output Summary (Last 24 hours) at 02/11/17 0943 Last data filed at 02/11/17 0900  Gross per 24 hour  Intake              206 ml  Output              400 ml  Net             -194 ml   Filed Weights   02/10/17 1541 02/11/17 0600  Weight: 221 lb (100.2 kg) 220 lb 6.4 oz (100 kg)    Telemetry    No adverse arrhythmias- Personally Reviewed  ECG    Diffuse T wave depression consistent with ischemia, normal sinus rhythm- Personally Reviewed  Physical Exam   GEN: No acute distress.  Overweight Neck: No JVD Cardiac: RRR, no murmurs, rubs, or gallops.  Respiratory: Clear to auscultation bilaterally. GI: Soft, nontender, non-distended  MS: No edema; No deformity. Neuro:  Nonfocal  Psych: Normal affect   Labs    Chemistry Recent Labs Lab 02/10/17 1252 02/10/17 1545 02/10/17 2138 02/11/17 0528  NA 142 141 138 139  K 4.8 2.8* 4.4 4.4  CL 102 110 105 105  CO2 23 22 26 27   GLUCOSE 90 90 94 100*  BUN 12 10 10 9   CREATININE  1.00 0.98 1.06 1.00  CALCIUM 9.7 7.3* 8.7* 8.8*  PROT 7.8 5.7*  --   --   ALBUMIN 5.0 3.4*  --   --   AST 58* 40  --   --   ALT 50* 38  --   --   ALKPHOS 54 42  --   --   BILITOT 0.7 0.6  --   --   GFRNONAA 96 >60 >60 >60  GFRAA 111 >60 >60 >60  ANIONGAP  --  9 7 7      Hematology Recent Labs Lab 02/10/17 1252 02/10/17 1545 02/11/17 0528  WBC 6.7 5.7 7.1  RBC 5.38 4.64 4.95  HGB 13.5 11.4* 12.2*  HCT 41.5 36.9* 39.6  MCV 77* 79.5 80.0  MCH 25.1* 24.6* 24.6*  MCHC 32.5 30.9 30.8  RDW 17.7* 16.8* 17.1*  PLT 226 246 239    Cardiac Enzymes Recent Labs Lab 02/10/17 1130 02/10/17 1545  TROPONINI 10.67* 1.77*   No results for input(s): TROPIPOC in the last 168 hours.  BNPNo results for input(s): BNP, PROBNP in the last 168 hours.   DDimer No results for input(s): DDIMER in the last 168 hours.   Radiology    Dg Chest Port 1 View  Result Date: 02/10/2017 CLINICAL DATA:  Chest pain EXAM: PORTABLE CHEST 1 VIEW COMPARISON:  Chest radiograph 02/11/2014 FINDINGS: Shallow lung inflation. There is cardiomegaly without pulmonary edema. No focal airspace consolidation, pleural effusion or pneumothorax. Normal visualized osseous structures. IMPRESSION: Cardiomegaly and shallow lung inflation without focal airspace disease. Electronically Signed   By: Deatra RobinsonKevin  Herman M.D.   On: 02/10/2017 17:15    Cardiac Studies   Cardiac catheterization 02/10/17: Diagnostic Diagram        Patient Profile     36 y.o. male with non-ST elevation myocardial infarction, severe triple-vessel coronary artery disease as described above from cardiac catheterization on 02/10/17 with essential hypertension, non-smoker, nondiabetic awaiting CABG, Dr. Maren BeachVanTrigt  Assessment & Plan    Non-ST elevation myocardial infarction - Continue with IV heparin -Echocardiogram pending, EF estimated in the 45% range on cardiac catheterization -Aspirin, high intensity statin, beta-blocker.  Nitroglycerin as needed,  currently chest pain-free.  Awaiting results of lipid panel. -Appreciate cardiothoracic surgery consultation.  Await surgery next week. - Scientific laboratory technicianUtilized translator via Database administratoriPad application.   For questions or updates, please contact [facility] Please consult [email or web address] for contact info under Cardiology/STEMI.      Signed, Donato SchultzMark Slayton Lubitz, MD  02/11/2017, 9:43 AM

## 2017-02-11 NOTE — Progress Notes (Signed)
ANTICOAGULATION CONSULT NOTE - Initial Consult  Pharmacy Consult for heparin dosing Indication: CAD awaiting CABG consult   No Known Allergies  Patient Measurements: Height: 5\' 9"  (175.3 cm) Weight: 220 lb 6.4 oz (100 kg) IBW/kg (Calculated) : 70.7 Heparin Dosing Weight: 91.9kg  Vital Signs: Temp: 99 F (37.2 C) (10/19 0736) Temp Source: Oral (10/19 0736) BP: 141/88 (10/19 1000) Pulse Rate: 74 (10/19 1000)  Labs:  Recent Labs  02/10/17 1130  02/10/17 1252 02/10/17 1545 02/10/17 2138 02/11/17 0528 02/11/17 0852  HGB  --   --  13.5 11.4*  --  12.2*  --   HCT  --   --  41.5 36.9*  --  39.6  --   PLT  --   --  226 246  --  239  --   LABPROT  --   --   --   --   --  13.4  --   INR  --   --   --   --   --  1.03  --   HEPARINUNFRC  --   --   --   --   --   --  <0.10*  CREATININE  --   < > 1.00 0.98 1.06 1.00  --   TROPONINI 10.67*  --   --  1.77*  --   --   --   < > = values in this interval not displayed.  Estimated Creatinine Clearance: 117.9 mL/min (by C-G formula based on SCr of 1 mg/dL).   Medical History: Past Medical History:  Diagnosis Date  . Essential hypertension, benign    Assessment:  36 yom found to have severe 3-vessel CAD- now awaiting CTVS evaluation. Heparin was resumed two hours after TR band removal last night on 10/19@0100 . Level came back this morning undetectable on heparin 1200 units/hr. Confirmed with nursing no interruptions to heparin infusion. No signs/symptoms of bleeding noted. CBC remains stable.   Goal of Therapy:  Heparin level 0.3-0.7 units/ml Monitor platelets by anticoagulation protocol: Yes   Plan:  Give 2700 units bolus once Increase heparin infusion to 1500 units/hr  Obtain 6 hour heparin level Monitor daily heparin level, CBC, and s/sx of bleeding.   Girard Cooter, PharmD Clinical Pharmacist  Phone: [phone] 02/11/2017,11:26 AM

## 2017-02-11 NOTE — Consult Note (Signed)
[address]       [place] [address]             [phone]        [name] [name] [facility] Medical Record #[id number] Date of Birth: 09-24-1980  Referring: No ref. provider found Primary Care: Patient, No Pcp Per  Chief Complaint:    Chief Complaint  Patient presents with  . Abnormal Lab    History of Present Illness:     [name] [name] is a 36 year old male with a past medical history of hypertension and GERD who presented to an urgent care for an annual exam with two weeks of shortness of breath and the sensation of an accelerated heart rate. His symptoms became worse with activity. Usually symptoms last a few minutes then they go away. He did have similar symptoms about a year ago but the pain was not as intense, he did not seek medical therapy. He went to the hospital about 2.5-3 years ago for similar pain but was told that he had acid reflux and was sent home. He did not have a cardiac catheterization at this time.  He is a Education administrator by trade and noted some substernal chest pain and radiation to his left arm after intense activity. He is a non-smoker and a non-diabetic. His original troponin was elevated to 10, then was down to 1.7 on repeat. He underwent cardiac cath and was found to have three vessel coronary artery disease. He is currently chest pain free. We are consulted for possible surgical revascularization. A Translator was used during our entire interview.    Current Activity/ Functional Status: Patient was independent with mobility/ambulation, transfers, ADL's, IADL's.   Zubrod Score: At the time of surgery this patient's most appropriate activity status/level should be described as: []     0    Normal activity, no symptoms [x]     1    Restricted in physical strenuous activity but ambulatory, able to do out light work []     2    Ambulatory and capable of self care, unable to do work activities, up and about                 more than  50%  Of the time                            []     3    Only limited self care, in bed greater than 50% of waking hours []     4    Completely disabled, no self care, confined to bed or chair []     5    Moribund  Past Medical History:  Diagnosis Date  . Essential hypertension, benign     Past Surgical History:  Procedure Laterality Date  . LEFT HEART CATH AND CORONARY ANGIOGRAPHY N/A 02/10/2017   Procedure: LEFT HEART CATH AND CORONARY ANGIOGRAPHY;  Surgeon: Yvonne Kendall, MD;  Location: [facility] INVASIVE [facility];  Service: Cardiovascular;  Laterality: N/A;    History  Smoking Status  . Never Smoker  Smokeless Tobacco  . Never Used    History  Alcohol Use  . Yes    Social History   Social History  . Marital status: Married    Spouse name: N/A  . Number of children: N/A  . Years of education: N/A   Occupational History  . Not on file.   Social History Main Topics  .  Smoking status: Never Smoker  . Smokeless tobacco: Never Used  . Alcohol use Yes  . Drug use: No  . Sexual activity: Not on file   Other Topics Concern  . Not on file   Social History Narrative  . No narrative on file    No Known Allergies  Current Facility-Administered Medications  Medication Dose Route Frequency Provider Last Rate Last Dose  . 0.9 %  sodium chloride infusion   Intravenous Continuous Leone BrandIngold, Laura R, NP 10 mL/hr at 02/11/17 1200    . 0.9 %  sodium chloride infusion  250 mL Intravenous PRN End, Cristal Deerhristopher, MD      . acetaminophen (TYLENOL) tablet 650 mg  650 mg Oral Q4H PRN Leone BrandIngold, Laura R, NP      . aspirin EC tablet 81 mg  81 mg Oral Daily Leone BrandIngold, Laura R, NP   81 mg at 02/11/17 0958  . atorvastatin (LIPITOR) tablet 80 mg  80 mg Oral q1800 Jake BatheSkains, Mark C, MD   80 mg at 02/11/17 0958  . heparin ADULT infusion 100 units/mL (25000 units/23350mL sodium chloride 0.45%)  1,500 Units/hr Intravenous Continuous Jake BatheSkains, Mark C, MD 15 mL/hr at 02/11/17 1202 1,500 Units/hr at 02/11/17 1202  .  metoprolol tartrate (LOPRESSOR) tablet 50 mg  50 mg Oral BID Jake BatheSkains, Mark C, MD   50 mg at 02/11/17 0957  . nitroGLYCERIN (NITROSTAT) SL tablet 0.4 mg  0.4 mg Sublingual Q5 Min x 3 PRN Nada BoozerIngold, Laura R, NP      . nitroGLYCERIN 50 mg in dextrose 5 % 250 mL (0.2 mg/mL) infusion  0-200 mcg/min Intravenous Titrated End, Christopher, MD      . ondansetron (ZOFRAN) injection 4 mg  4 mg Intravenous Q6H PRN Leone BrandIngold, Laura R, NP      . potassium chloride SA (K-DUR,KLOR-CON) CR tablet 40 mEq  40 mEq Oral BID Jake BatheSkains, Mark C, MD   40 mEq at 02/11/17 0957  . sodium chloride flush (NS) 0.9 % injection 3 mL  3 mL Intravenous Q12H End, Christopher, MD   3 mL at 02/11/17 1000  . sodium chloride flush (NS) 0.9 % injection 3 mL  3 mL Intravenous PRN End, Cristal Deerhristopher, MD        Prescriptions Prior to Admission  Medication Sig Dispense Refill Last Dose  . naproxen sodium (ANAPROX) 220 MG tablet Take 660 mg by mouth daily as needed.   Past Week at Unknown time  . amLODipine (NORVASC) 5 MG tablet Take 1 tablet (5 mg total) by mouth daily. 30 tablet 2   . clotrimazole (LOTRIMIN) 1 % cream Apply 1 application topically 2 (two) times daily. 30 g 0   . labetalol (NORMODYNE) 100 MG tablet Take 1 tablet (100 mg total) by mouth 2 (two) times daily. 60 tablet 2   . omeprazole (PRILOSEC) 20 MG capsule Take 1 capsule (20 mg total) by mouth 2 (two) times daily before a meal. 60 capsule 2     Family History  Problem Relation Age of Onset  . Diabetes Sister   . Diabetes Brother      Review of Systems:  Pertinent items are noted in HPI.     Cardiac Review of Systems: Y or N  Chest Pain [ Y   ]  Resting SOB [ Y  ] Exertional SOB  [ Y ]  Orthopnea [  ]   Pedal Edema [  N ]    Palpitations [Y  ] Syncope  [  ]  Presyncope [   ]  General Review of Systems: [Y] = yes [  ]=no Constitional: recent weight change [  ]; anorexia [  ]; fatigue [  ]; nausea [  ]; night sweats [  ]; fever [ N ]; or chills [ N ]                                                                Dental: poor dentition[  ]; Last Dentist visit:   Eye : blurred vision [  ]; diplopia [   ]; vision changes [  ];  Amaurosis fugax[  ]; Resp: cough [ N ];  wheezing[ N ];  hemoptysis[  ]; shortness of breath[ Y ]; paroxysmal nocturnal dyspnea[  ]; dyspnea on exertion[ Y ]; or orthopnea[  ];  GI:  gallstones[  ], vomiting[ N ];  dysphagia[  ]; melena[  ];  hematochezia [  ]; heartburn[ Y ];   Hx of  Colonoscopy[  ]; GU: kidney stones [  ]; hematuria[  ];   dysuria [  ];  nocturia[  ];  history of  obstruction [ N ]; urinary frequency [  ]             Skin: rash, swelling[  ];, hair loss[  ];  peripheral edema[ N ];  or itching[  ]; Musculosketetal: myalgias[  ];  joint swelling[  ];  joint erythema[  ];  joint pain[  ];  back pain[  ];  Heme/Lymph: bruising[  ];  bleeding[  ];  anemia[  ];  Neuro: Deyanira.Kussmaul  ];  headaches[  ];  stroke[  N];  vertigo[  ];  seizures[  ];   paresthesias[  ];  difficulty walking[  ];  Psych:depression[N  ]; anxiety[  N];  Endocrine: diabetes[N  ];  thyroid dysfunction[N  ];  Immunizations: Flu [  ]; Pneumococcal[  ];  Other:  Physical Exam: BP 131/86 (BP Location: Left Arm)   Pulse 89   Temp 98.5 F (36.9 C) (Oral)   Resp (!) 24   Ht 5\' 9"  (1.753 m)   Wt 220 lb 6.4 oz (100 kg)   SpO2 98%   BMI 32.55 kg/m    General appearance: alert, cooperative and no distress Resp: clear to auscultation bilaterally Cardio: regular rate and rhythm, S1, S2 normal, no murmur, click, rub or gallop GI: soft, non-tender; bowel sounds normal; no masses,  no organomegaly Extremities: extremities normal, atraumatic, no cyanosis or edema   Cardiac Catheterization:  Conclusion   Conclusions: 1. Severe three vessel coronary artery disease, as detailed below. 2. Moderately reduced left ventricular ejection fraction with global hypokinesis. 3. Mildly elevated left ventricular filling pressure.  Recommendations: 1. Admit to 2  Heart. 2. IV NTG for BP control and anti-anginal therapy (though patient is currently chest pain free). 3. Restart heparin infusion 2 hours after TR band has been deflated. 4. Cardiac surgery consultation has been called (discussed with Dr. Donata Clay). 5. Statin therapy and metoprolol, as previously ordered. 6. Follow-up echo.  Yvonne Kendall, MD [facility] Pager: [phone]        Recent Radiology Findings:   Dg Chest Port 1 View  Result Date: 02/10/2017 CLINICAL DATA:  Chest pain EXAM: PORTABLE CHEST 1  VIEW COMPARISON:  Chest radiograph 02/11/2014 FINDINGS: Shallow lung inflation. There is cardiomegaly without pulmonary edema. No focal airspace consolidation, pleural effusion or pneumothorax. Normal visualized osseous structures. IMPRESSION: Cardiomegaly and shallow lung inflation without focal airspace disease. Electronically Signed   By: Deatra Robinson M.D.   On: 02/10/2017 17:15     I have independently reviewed the above radiologic studies.  Recent Lab Findings: Lab Results  Component Value Date   WBC 7.1 02/11/2017   HGB 12.2 (L) 02/11/2017   HCT 39.6 02/11/2017   PLT 239 02/11/2017   GLUCOSE 100 (H) 02/11/2017   CHOL 233 (H) 02/10/2017   TRIG 209 (H) 02/10/2017   HDL 38 (L) 02/10/2017   LDLCALC 153 (H) 02/10/2017   ALT 38 02/10/2017   AST 40 02/10/2017   NA 139 02/11/2017   K 4.4 02/11/2017   CL 105 02/11/2017   CREATININE 1.00 02/11/2017   BUN 9 02/11/2017   CO2 27 02/11/2017   TSH 4.770 (H) 02/10/2017   INR 1.03 02/11/2017      Assessment / Plan:   Possible coronary artery bypass grafting on Tuesday 10/23 with Dr. Donata Clay. The procedure was explained in detail to the patient and wife at the bedside. All questions were answered to their satisfaction.   Agree with above assesment and findings  Patient examined and coronary arteriograms reviewed 36 yo male nondiabetic with nonstemi and severe 3 v CAD Currently stable on iv heparin. Echo  pending CXR clear Plan CABG next week 10-2 3    I  spent 30 minutes counseling the patient face to face and 50% or more the  time was spent in counseling and coordination of care. The total time spent in the appointment was 60 minutes.  T Conte PA-C    Agree with above assesment and findings  Patient examined and coronary arteriograms reviewed 36 yo male nondiabetic with nonstemi and severe 3 v CAD Currently stable on iv heparin. Echo pending CXR clear Plan CABG next week 10-23  P Donata Clay MD

## 2017-02-12 ENCOUNTER — Inpatient Hospital Stay (HOSPITAL_COMMUNITY): Payer: Self-pay

## 2017-02-12 DIAGNOSIS — I340 Nonrheumatic mitral (valve) insufficiency: Secondary | ICD-10-CM

## 2017-02-12 LAB — COMPREHENSIVE METABOLIC PANEL
ALT: 42 U/L (ref 17–63)
AST: 31 U/L (ref 15–41)
Albumin: 4.1 g/dL (ref 3.5–5.0)
Alkaline Phosphatase: 47 U/L (ref 38–126)
Anion gap: 9 (ref 5–15)
BUN: 12 mg/dL (ref 6–20)
CO2: 25 mmol/L (ref 22–32)
Calcium: 8.8 mg/dL — ABNORMAL LOW (ref 8.9–10.3)
Chloride: 102 mmol/L (ref 101–111)
Creatinine, Ser: 1.04 mg/dL (ref 0.61–1.24)
GFR calc Af Amer: >60 mL/min (ref >60)
GFR calc non Af Amer: >60 mL/min (ref >60)
Glucose, Bld: 94 mg/dL (ref 65–99)
Potassium: 4.5 mmol/L (ref 3.5–5.1)
Sodium: 136 mmol/L (ref 135–145)
Total Bilirubin: 0.9 mg/dL (ref 0.3–1.2)
Total Protein: 7.3 g/dL (ref 6.5–8.1)

## 2017-02-12 LAB — CBC
HCT: 39.9 % (ref 39.0–52.0)
Hemoglobin: 12.4 g/dL — ABNORMAL LOW (ref 13.0–17.0)
MCH: 24.8 pg — AB (ref 26.0–34.0)
MCHC: 31.1 g/dL (ref 30.0–36.0)
MCV: 80 fL (ref 78.0–100.0)
PLATELETS: 240 10*3/uL (ref 150–400)
RBC: 4.99 MIL/uL (ref 4.22–5.81)
RDW: 17.3 % — ABNORMAL HIGH (ref 11.5–15.5)
WBC: 7 10*3/uL (ref 4.0–10.5)

## 2017-02-12 LAB — HEPARIN LEVEL (UNFRACTIONATED): HEPARIN UNFRACTIONATED: 0.49 [IU]/mL (ref 0.30–0.70)

## 2017-02-12 LAB — ECHOCARDIOGRAM COMPLETE
Height: 69 in
Weight: 3526.4 oz

## 2017-02-12 LAB — PROTIME-INR
INR: 0.96
Prothrombin Time: 12.7 seconds (ref 11.4–15.2)

## 2017-02-12 NOTE — Progress Notes (Signed)
  Echocardiogram 2D Echocardiogram has been performed.  Shere Eisenhart T Nasri Boakye 02/12/2017, 8:35 AM

## 2017-02-12 NOTE — Progress Notes (Signed)
CARDIAC REHAB PHASE I   PRE:  Rate/Rhythm: 77 SR  BP:  Sitting: 125/86        SaO2: 99 RA  MODE:  Ambulation: 740 ft   POST:  Rate/Rhythm: 80 SR  BP:  Sitting: 136/90         SaO2: 97 RA  Pt ambulated 740 ft on RA, IV, assist x1, steady gait, tolerated well with no complaints. Pt has IS, cardiac surgery booklet in Spanish, will follow up Monday to complete pre-op education. Encouraged additional ambulation as tolerated. Pt to recliner after walk, call bell within reach. Will follow.   [id number] Joylene Grapes, RN, BSN 02/12/2017 1:29 PM

## 2017-02-12 NOTE — Progress Notes (Signed)
ANTICOAGULATION CONSULT NOTE - Follow Up Consult  Pharmacy Consult for heparin dosing Indication: CAD awaiting CABG consult   No Known Allergies  Patient Measurements: Height: 5\' 9"  (175.3 cm) Weight: 220 lb 6.4 oz (100 kg) IBW/kg (Calculated) : 70.7 Heparin Dosing Weight: 91.9kg  Vital Signs: Temp: 97.8 F (36.6 C) (10/20 0735) Temp Source: Oral (10/20 0735) BP: 130/89 (10/20 0700) Pulse Rate: 73 (10/20 0700)  Labs:  Recent Labs  02/10/17 1130  02/10/17 1545 02/10/17 2138 02/11/17 0528 02/11/17 0852 02/11/17 1907 02/12/17 0301  HGB  --   < > 11.4*  --  12.2*  --   --  12.4*  HCT  --   < > 36.9*  --  39.6  --   --  39.9  PLT  --   < > 246  --  239  --   --  240  LABPROT  --   --   --   --  13.4  --   --  12.7  INR  --   --   --   --  1.03  --   --  0.96  HEPARINUNFRC  --   --   --   --   --  <0.10* 0.31 0.49  CREATININE  --   < > 0.98 1.06 1.00  --   --  1.04  TROPONINI 10.67*  --  1.77*  --   --   --   --   --   < > = values in this interval not displayed.  Estimated Creatinine Clearance: 113.3 mL/min (by C-G formula based on SCr of 1.04 mg/dL).   Medical History: Past Medical History:  Diagnosis Date  . Essential hypertension, benign    Assessment:  36 yom found to have severe 3-vessel CAD- plan for CABG next week on 10/23 per notes. Heparin level last night came back within therapeutic range (on lower end). Level this morning came back at 0.49, within goal range, on 1550 units/hr. CBC is stable. No signs/symptoms of bleeding noted. No infusion issues with heparin per nursing.   Goal of Therapy:  Heparin level 0.3-0.7 units/ml Monitor platelets by anticoagulation protocol: Yes   Plan:  Continue heparin infusion at 1550 units/hr  Monitor daily heparin level, CBC, and s/sx of bleeding.   Girard Cooter, PharmD Clinical Pharmacist  Phone: [phone]

## 2017-02-12 NOTE — Progress Notes (Signed)
Progress Note  Patient Name: [name] [name] Date of Encounter: 02/12/2017  Primary Cardiologist: Anne Fu  Patient Profile     36 y.o. male with non-ST elevation myocardial infarction, severe triple-vessel coronary artery disease as described above from cardiac catheterization on 02/10/17 with essential hypertension, non-smoker, nondiabetic awaiting CABG, Dr. Maren Beach      Subjective    no chest pain sob Spoke with pt and wife through interpreter Questions re CABG  Inpatient Medications    Scheduled Meds: . aspirin EC  81 mg Oral Daily  . atorvastatin  80 mg Oral q1800  . Chlorhexidine Gluconate Cloth  6 each Topical Daily  . metoprolol tartrate  50 mg Oral BID  . mupirocin ointment  1 application Nasal BID  . potassium chloride  40 mEq Oral BID  . sodium chloride flush  3 mL Intravenous Q12H   Continuous Infusions: . sodium chloride 10 mL/hr at 02/11/17 1900  . sodium chloride    . heparin 1,550 Units/hr (02/11/17 2101)  . nitroGLYCERIN     PRN Meds: sodium chloride, acetaminophen, nitroGLYCERIN, ondansetron (ZOFRAN) IV, sodium chloride flush   Vital Signs    Vitals:   02/12/17 0500 02/12/17 0600 02/12/17 0700 02/12/17 0735  BP: 119/72 127/86 130/89   Pulse: 65 64 73   Resp: 19 (!) 9 (!) 7   Temp:    97.8 F (36.6 C)  TempSrc:    Oral  SpO2: 97% 93% 98%   Weight:      Height:        Intake/Output Summary (Last 24 hours) at 02/12/17 0939 Last data filed at 02/12/17 0600  Gross per 24 hour  Intake           507.99 ml  Output              625 ml  Net          -117.01 ml   Filed Weights   02/10/17 1541 02/11/17 0600  Weight: 221 lb (100.2 kg) 220 lb 6.4 oz (100 kg)    Telemetry  Normal sinus without ectopy- Personally Reviewed  ECG       Physical Exam  Well developed and nourished in no acute distress HENT normal Neck supple with JVP-flat Carotids brisk and full without bruits Clear Regular rate and rhythm, no murmurs or  gallops Abd-soft with active BS without hepatomegaly No Clubbing cyanosis edema Skin-warm and dry A & Oriented  Grossly normal sensory and motor function   Labs    Chemistry Recent Labs Lab 02/10/17 1252  02/10/17 1545 02/10/17 2138 02/11/17 0528 02/12/17 0301  NA 142  < > 141 138 139 136  K 4.8  --  2.8* 4.4 4.4 4.5  CL 102  --  110 105 105 102  CO2 23  --  22 26 27 25   GLUCOSE 90  < > 90 94 100* 94  BUN 12  < > 10 10 9 12   CREATININE 1.00  --  0.98 1.06 1.00 1.04  CALCIUM 9.7  --  7.3* 8.7* 8.8* 8.8*  PROT 7.8  --  5.7*  --   --  7.3  ALBUMIN 5.0  --  3.4*  --   --  4.1  AST 58*  --  40  --   --  31  ALT 50*  --  38  --   --  42  ALKPHOS 54  --  42  --   --  47  BILITOT 0.7  --  0.6  --   --  0.9  GFRNONAA 96  --  >60 >60 >60 >60  GFRAA 111  --  >60 >60 >60 >60  ANIONGAP  --   < > 9 7 7 9   < > = values in this interval not displayed.   Hematology  Recent Labs Lab 02/10/17 1545 02/11/17 0528 02/12/17 0301  WBC 5.7 7.1 7.0  RBC 4.64 4.95 4.99  HGB 11.4* 12.2* 12.4*  HCT 36.9* 39.6 39.9  MCV 79.5 80.0 80.0  MCH 24.6* 24.6* 24.8*  MCHC 30.9 30.8 31.1  RDW 16.8* 17.1* 17.3*  PLT 246 239 240    Cardiac Enzymes  Recent Labs Lab 02/10/17 1130 02/10/17 1545  TROPONINI 10.67* 1.77*   No results for input(s): TROPIPOC in the last 168 hours.   BNPNo results for input(s): BNP, PROBNP in the last 168 hours.   DDimer No results for input(s): DDIMER in the last 168 hours.   Radiology    Dg Chest Port 1 View  Result Date: 02/10/2017 CLINICAL DATA:  Chest pain EXAM: PORTABLE CHEST 1 VIEW COMPARISON:  Chest radiograph 02/11/2014 FINDINGS: Shallow lung inflation. There is cardiomegaly without pulmonary edema. No focal airspace consolidation, pleural effusion or pneumothorax. Normal visualized osseous structures. IMPRESSION: Cardiomegaly and shallow lung inflation without focal airspace disease. Electronically Signed   By: Deatra RobinsonKevin  Herman M.D.   On: 02/10/2017 17:15      Cardiac Studies   Cardiac catheterization 02/10/17: Diagnostic Diagram       Echo P   Assessment & Plan    Non-ST elevation myocardial infarction 3V CAD LV dysfunciton mild Hyperlipidemia      - Continue with IV heparin -Echocardiogram pending, EF estimated in the 45% range on cardiac catheterization -Aspirin, high intensity statin, beta-blocker.  Nitroglycerin as needed,     Feel like he needs more information re CABG     Signed, Sherryl MangesSteven Klein, MD  02/12/2017, 9:39 AM

## 2017-02-13 LAB — HEMOGLOBIN A1C
Hgb A1c MFr Bld: 5.8 % — ABNORMAL HIGH (ref 4.8–5.6)
Mean Plasma Glucose: 120 mg/dL

## 2017-02-13 LAB — CBC
HEMATOCRIT: 40.3 % (ref 39.0–52.0)
HEMOGLOBIN: 12.7 g/dL — AB (ref 13.0–17.0)
MCH: 25.1 pg — ABNORMAL LOW (ref 26.0–34.0)
MCHC: 31.5 g/dL (ref 30.0–36.0)
MCV: 79.6 fL (ref 78.0–100.0)
Platelets: 239 10*3/uL (ref 150–400)
RBC: 5.06 MIL/uL (ref 4.22–5.81)
RDW: 16.7 % — AB (ref 11.5–15.5)
WBC: 8.1 10*3/uL (ref 4.0–10.5)

## 2017-02-13 LAB — HEPARIN LEVEL (UNFRACTIONATED): Heparin Unfractionated: 0.51 IU/mL (ref 0.30–0.70)

## 2017-02-13 MED ORDER — METOPROLOL TARTRATE 50 MG PO TABS
50.0000 mg | ORAL_TABLET | Freq: Two times a day (BID) | ORAL | Status: AC
  Administered 2017-02-13: 50 mg via ORAL
  Filled 2017-02-13: qty 1

## 2017-02-13 MED ORDER — METOPROLOL SUCCINATE ER 50 MG PO TB24
50.0000 mg | ORAL_TABLET | Freq: Every day | ORAL | Status: DC
  Administered 2017-02-14: 50 mg via ORAL
  Filled 2017-02-13: qty 1

## 2017-02-13 MED ORDER — METOPROLOL SUCCINATE ER 50 MG PO TB24: 50.0000 mg | ORAL_TABLET | Freq: Every day | ORAL | Status: DC

## 2017-02-13 NOTE — Progress Notes (Signed)
Progress Note  Patient Name: [name] [name] Date of Encounter: 02/13/2017  Primary Cardiologist: Anne FuSkains  Patient Profile     36 y.o. male with non-ST elevation myocardial infarction, severe triple-vessel coronary artery disease as described above from cardiac catheterization on 02/10/17 with essential hypertension, non-smoker, nondiabetic awaiting CABG, Dr. Maren BeachVanTrigt      Subjective   Without chest pain or shortness of breath  No questions   Inpatient Medications    Scheduled Meds: . aspirin EC  81 mg Oral Daily  . atorvastatin  80 mg Oral q1800  . Chlorhexidine Gluconate Cloth  6 each Topical Daily  . metoprolol tartrate  50 mg Oral BID  . mupirocin ointment  1 application Nasal BID  . sodium chloride flush  3 mL Intravenous Q12H   Continuous Infusions: . sodium chloride 10 mL/hr at 02/13/17 0456  . sodium chloride    . heparin 1,550 Units/hr (02/12/17 2354)  . nitroGLYCERIN     PRN Meds: sodium chloride, acetaminophen, nitroGLYCERIN, ondansetron (ZOFRAN) IV, sodium chloride flush   Vital Signs    Vitals:   02/13/17 0500 02/13/17 0600 02/13/17 0700 02/13/17 0723  BP: 116/77 122/70 (!) 122/99   Pulse: 83 81 83   Resp: 12 10 (!) 21   Temp:    98.3 F (36.8 C)  TempSrc:    Oral  SpO2: 99% 97% 99%   Weight: 217 lb 6.4 oz (98.6 kg)     Height:        Intake/Output Summary (Last 24 hours) at 02/13/17 0904 Last data filed at 02/13/17 0700  Gross per 24 hour  Intake             1361 ml  Output              500 ml  Net              861 ml   Filed Weights   02/10/17 1541 02/11/17 0600 02/13/17 0500  Weight: 221 lb (100.2 kg) 220 lb 6.4 oz (100 kg) 217 lb 6.4 oz (98.6 kg)    Telemetry  Personally Reviewed nsr   ECG  Personally reviewed  Sinus with narrow QRS     Physical Exam  Well developed and nourished in no acute distress HENT normal Neck supple with JVP-flat Carotids brisk and full without bruits Clear Regular rate and rhythm, no  murmurs or gallops Abd-soft with active BS without hepatomegaly No Clubbing cyanosis edema Skin-warm and dry A & Oriented  Grossly normal sensory and motor function   Labs    Chemistry Recent Labs Lab 02/10/17 1252  02/10/17 1545 02/10/17 2138 02/11/17 0528 02/12/17 0301  NA 142  < > 141 138 139 136  K 4.8  --  2.8* 4.4 4.4 4.5  CL 102  --  110 105 105 102  CO2 23  --  22 26 27 25   GLUCOSE 90  < > 90 94 100* 94  BUN 12  < > 10 10 9 12   CREATININE 1.00  --  0.98 1.06 1.00 1.04  CALCIUM 9.7  --  7.3* 8.7* 8.8* 8.8*  PROT 7.8  --  5.7*  --   --  7.3  ALBUMIN 5.0  --  3.4*  --   --  4.1  AST 58*  --  40  --   --  31  ALT 50*  --  38  --   --  42  ALKPHOS 54  --  42  --   --  47  BILITOT 0.7  --  0.6  --   --  0.9  GFRNONAA 96  --  >60 >60 >60 >60  GFRAA 111  --  >60 >60 >60 >60  ANIONGAP  --   < > 9 7 7 9   < > = values in this interval not displayed.   Hematology  Recent Labs Lab 02/11/17 0528 02/12/17 0301 02/13/17 0415  WBC 7.1 7.0 8.1  RBC 4.95 4.99 5.06  HGB 12.2* 12.4* 12.7*  HCT 39.6 39.9 40.3  MCV 80.0 80.0 79.6  MCH 24.6* 24.8* 25.1*  MCHC 30.8 31.1 31.5  RDW 17.1* 17.3* 16.7*  PLT 239 240 239    Cardiac Enzymes  Recent Labs Lab 02/10/17 1130 02/10/17 1545  TROPONINI 10.67* 1.77*   No results for input(s): TROPIPOC in the last 168 hours.   BNPNo results for input(s): BNP, PROBNP in the last 168 hours.   DDimer No results for input(s): DDIMER in the last 168 hours.   Radiology    No results found.  Cardiac Studies   Cardiac catheterization 02/10/17: Diagnostic Diagram       Echo >> ef 30-35%     Assessment & Plan    Non-ST elevation myocardial infarction 3V CAD LV dysfunciton mild Hyperlipidemia      Continue with IV heparin -Aspirin, high intensity statin, beta-blocker.   Nitroglycerin as needed,  \  With LV dysfunction will change meto tartrate to succinate And anticipate losartan post CABg    Feel like he needs  more information re CABG     Signed, Sherryl Manges, MD  02/13/2017, 9:04 AM

## 2017-02-13 NOTE — Progress Notes (Signed)
ANTICOAGULATION CONSULT NOTE - Follow Up Consult  Pharmacy Consult for heparin dosing Indication: CAD awaiting CABG consult   No Known Allergies  Patient Measurements: Height: 5\' 9"  (175.3 cm) Weight: 217 lb 6.4 oz (98.6 kg) IBW/kg (Calculated) : 70.7 Heparin Dosing Weight: 91.9kg  Vital Signs: Temp: 98.3 F (36.8 C) (10/21 0723) Temp Source: Oral (10/21 0723) BP: 122/99 (10/21 0700) Pulse Rate: 83 (10/21 0700)  Labs:  Recent Labs  02/10/17 1130  02/10/17 1545 02/10/17 2138 02/11/17 0528  02/11/17 1907 02/12/17 0301 02/13/17 0415  HGB  --   < > 11.4*  --  12.2*  --   --  12.4* 12.7*  HCT  --   < > 36.9*  --  39.6  --   --  39.9 40.3  PLT  --   < > 246  --  239  --   --  240 239  LABPROT  --   --   --   --  13.4  --   --  12.7  --   INR  --   --   --   --  1.03  --   --  0.96  --   HEPARINUNFRC  --   --   --   --   --   < > 0.31 0.49 0.51  CREATININE  --   < > 0.98 1.06 1.00  --   --  1.04  --   TROPONINI 10.67*  --  1.77*  --   --   --   --   --   --   < > = values in this interval not displayed.  Estimated Creatinine Clearance: 112.7 mL/min (by C-G formula based on SCr of 1.04 mg/dL).   Medical History: Past Medical History:  Diagnosis Date  . Essential hypertension, benign    Assessment:  36 yom found to have severe 3-vessel CAD- plan for CABG next week on 10/23 per notes. Heparin level has been therapeutic on 1550 units/hr. Level came back this morning at 0.51, within goal range. CBC remains stable. No signs/symptoms of bleeding noted. No infusion issues with heparin per nursing.   Goal of Therapy:  Heparin level 0.3-0.7 units/ml Monitor platelets by anticoagulation protocol: Yes   Plan:  Continue heparin infusion at 1550 units/hr  Monitor daily heparin level, CBC, and s/sx of bleeding.   Girard Cooter, PharmD Clinical Pharmacist  Phone: [phone]

## 2017-02-14 ENCOUNTER — Telehealth: Payer: Self-pay | Admitting: Family Medicine

## 2017-02-14 ENCOUNTER — Inpatient Hospital Stay (HOSPITAL_COMMUNITY): Payer: Self-pay

## 2017-02-14 DIAGNOSIS — Z01810 Encounter for preprocedural cardiovascular examination: Secondary | ICD-10-CM

## 2017-02-14 LAB — VAS US DOPPLER PRE CABG
LEFT ECA DIAS: -19 cm/s
LEFT VERTEBRAL DIAS: -18 cm/s
Left CCA dist dias: -29 cm/s
Left CCA dist sys: -71 cm/s
Left CCA prox dias: 31 cm/s
Left CCA prox sys: 104 cm/s
Left ICA dist dias: -47 cm/s
Left ICA dist sys: -90 cm/s
Left ICA prox dias: -33 cm/s
Left ICA prox sys: -59 cm/s
RIGHT ECA DIAS: -22 cm/s
RIGHT VERTEBRAL DIAS: -13 cm/s
Right CCA prox dias: 18 cm/s
Right CCA prox sys: 98 cm/s
Right cca dist sys: -74 cm/s

## 2017-02-14 LAB — CBC
HCT: 39.3 % (ref 39.0–52.0)
HEMOGLOBIN: 12.7 g/dL — AB (ref 13.0–17.0)
MCH: 25.4 pg — AB (ref 26.0–34.0)
MCHC: 32.3 g/dL (ref 30.0–36.0)
MCV: 78.6 fL (ref 78.0–100.0)
Platelets: 241 10*3/uL (ref 150–400)
RBC: 5 MIL/uL (ref 4.22–5.81)
RDW: 16.4 % — ABNORMAL HIGH (ref 11.5–15.5)
WBC: 8.2 10*3/uL (ref 4.0–10.5)

## 2017-02-14 LAB — PREPARE RBC (CROSSMATCH)

## 2017-02-14 LAB — HEPARIN LEVEL (UNFRACTIONATED): HEPARIN UNFRACTIONATED: 0.41 [IU]/mL (ref 0.30–0.70)

## 2017-02-14 MED ORDER — DIAZEPAM 5 MG PO TABS
5.0000 mg | ORAL_TABLET | Freq: Once | ORAL | Status: AC
  Administered 2017-02-15: 5 mg via ORAL
  Filled 2017-02-14: qty 1

## 2017-02-14 MED ORDER — VANCOMYCIN HCL 10 G IV SOLR
1500.0000 mg | INTRAVENOUS | Status: AC
  Administered 2017-02-15: 1500 mg via INTRAVENOUS
  Filled 2017-02-14 (×2): qty 1500

## 2017-02-14 MED ORDER — SODIUM CHLORIDE 0.9 % IV SOLN
INTRAVENOUS | Status: AC
  Administered 2017-02-15: 1 [IU]/h via INTRAVENOUS
  Filled 2017-02-14: qty 1

## 2017-02-14 MED ORDER — SODIUM CHLORIDE 0.9 % IV SOLN
INTRAVENOUS | Status: DC
  Filled 2017-02-14: qty 30

## 2017-02-14 MED ORDER — DEXTROSE 5 % IV SOLN
1.5000 g | INTRAVENOUS | Status: AC
  Administered 2017-02-15: 1.5 g via INTRAVENOUS
  Administered 2017-02-15: .75 g via INTRAVENOUS
  Filled 2017-02-14: qty 1.5

## 2017-02-14 MED ORDER — BISACODYL 5 MG PO TBEC
5.0000 mg | DELAYED_RELEASE_TABLET | Freq: Once | ORAL | Status: AC
  Administered 2017-02-14: 5 mg via ORAL
  Filled 2017-02-14: qty 1

## 2017-02-14 MED ORDER — TRANEXAMIC ACID (OHS) BOLUS VIA INFUSION
15.0000 mg/kg | INTRAVENOUS | Status: AC
  Administered 2017-02-15: 1473 mg via INTRAVENOUS
  Filled 2017-02-14: qty 1473

## 2017-02-14 MED ORDER — PAPAVERINE HCL 30 MG/ML IJ SOLN
INTRAMUSCULAR | Status: AC
  Administered 2017-02-15: 500 mL
  Filled 2017-02-14: qty 2.5

## 2017-02-14 MED ORDER — ALPRAZOLAM 0.25 MG PO TABS: 0.2500 mg | ORAL_TABLET | ORAL | Status: DC | PRN

## 2017-02-14 MED ORDER — DEXTROSE 5 % IV SOLN
750.0000 mg | INTRAVENOUS | Status: DC
  Filled 2017-02-14: qty 750

## 2017-02-14 MED ORDER — PHENYLEPHRINE HCL 10 MG/ML IJ SOLN
30.0000 ug/min | INTRAMUSCULAR | Status: DC
  Filled 2017-02-14: qty 2

## 2017-02-14 MED ORDER — CHLORHEXIDINE GLUCONATE 4 % EX LIQD
60.0000 mL | Freq: Once | CUTANEOUS | Status: AC
  Administered 2017-02-14: 4 via TOPICAL

## 2017-02-14 MED ORDER — TRANEXAMIC ACID 1000 MG/10ML IV SOLN
1.5000 mg/kg/h | INTRAVENOUS | Status: AC
  Administered 2017-02-15: 1.5 mg/kg/h via INTRAVENOUS
  Filled 2017-02-14: qty 25

## 2017-02-14 MED ORDER — TRANEXAMIC ACID (OHS) PUMP PRIME SOLUTION
2.0000 mg/kg | INTRAVENOUS | Status: DC
  Filled 2017-02-14: qty 1.96

## 2017-02-14 MED ORDER — DEXMEDETOMIDINE HCL IN NACL 400 MCG/100ML IV SOLN
0.1000 ug/kg/h | INTRAVENOUS | Status: AC
  Administered 2017-02-15: 0.7 ug/kg/h via INTRAVENOUS
  Filled 2017-02-14: qty 100

## 2017-02-14 MED ORDER — POTASSIUM CHLORIDE 2 MEQ/ML IV SOLN
80.0000 meq | INTRAVENOUS | Status: DC
  Filled 2017-02-14: qty 40

## 2017-02-14 MED ORDER — DOPAMINE-DEXTROSE 3.2-5 MG/ML-% IV SOLN
0.0000 ug/kg/min | INTRAVENOUS | Status: DC
  Filled 2017-02-14: qty 250

## 2017-02-14 MED ORDER — SODIUM CHLORIDE 0.9 % IV SOLN: 1250.0000 mg | INTRAVENOUS | Status: DC

## 2017-02-14 MED ORDER — NITROGLYCERIN IN D5W 200-5 MCG/ML-% IV SOLN
2.0000 ug/min | INTRAVENOUS | Status: DC
  Filled 2017-02-14: qty 250

## 2017-02-14 MED ORDER — TEMAZEPAM 15 MG PO CAPS: 15.0000 mg | ORAL_CAPSULE | Freq: Once | ORAL | Status: DC | PRN

## 2017-02-14 MED ORDER — CHLORHEXIDINE GLUCONATE 4 % EX LIQD
60.0000 mL | Freq: Once | CUTANEOUS | Status: AC
  Administered 2017-02-15: 4 via TOPICAL
  Filled 2017-02-14: qty 15

## 2017-02-14 MED ORDER — DEXTROSE 5 % IV SOLN
0.0000 ug/min | INTRAVENOUS | Status: DC
  Filled 2017-02-14: qty 4

## 2017-02-14 MED ORDER — CHLORHEXIDINE GLUCONATE 0.12 % MT SOLN
15.0000 mL | Freq: Once | OROMUCOSAL | Status: AC
  Administered 2017-02-15: 15 mL via OROMUCOSAL
  Filled 2017-02-14: qty 15

## 2017-02-14 MED ORDER — MAGNESIUM SULFATE 50 % IJ SOLN
40.0000 meq | INTRAMUSCULAR | Status: DC
  Filled 2017-02-14: qty 10

## 2017-02-14 MED ORDER — METOPROLOL TARTRATE 12.5 MG HALF TABLET
12.5000 mg | ORAL_TABLET | Freq: Once | ORAL | Status: AC
  Administered 2017-02-15: 12.5 mg via ORAL
  Filled 2017-02-14: qty 1

## 2017-02-14 NOTE — Progress Notes (Signed)
CARDIAC REHAB PHASE I   PRE:  Rate/Rhythm: 84 SR  BP:  Sitting: 129/82        SaO2: 98 RA  MODE:  Ambulation: 740 ft   POST:  Rate/Rhythm: 90 SR  BP:  Sitting: 131/92         SaO2: 98 RA  Pt ambulated 740 ft on RA, IV, hand held assist, steady gait, tolerated well with no complaints. Completed pre-op education with pt and wife at bedside. Reviewed IS, activity progression, sternal precautions and cardiac surgery booklet. Pt and wife verbalized understanding. Pt to recliner after walk, call bell within reach. Will follow.   [phone] Joylene Grapes, RN, BSN 02/14/2017 3:19 PM

## 2017-02-14 NOTE — Progress Notes (Signed)
ANTICOAGULATION CONSULT NOTE - Follow Up Consult  Pharmacy Consult for heparin dosing Indication: CAD awaiting CABG consult   No Known Allergies  Patient Measurements: Height: 5\' 9"  (175.3 cm) Weight: 216 lb 8 oz (98.2 kg) IBW/kg (Calculated) : 70.7 Heparin Dosing Weight: 91.9kg  Vital Signs: Temp: 98.8 F (37.1 C) (10/22 0742) Temp Source: Oral (10/22 0742) BP: 122/87 (10/22 0700) Pulse Rate: 81 (10/22 0700)  Labs:  Recent Labs  02/12/17 0301 02/13/17 0415 02/14/17 0224  HGB 12.4* 12.7* 12.7*  HCT 39.9 40.3 39.3  PLT 240 239 241  LABPROT 12.7  --   --   INR 0.96  --   --   HEPARINUNFRC 0.49 0.51 0.41  CREATININE 1.04  --   --     Estimated Creatinine Clearance: 112.4 mL/min (by C-G formula based on SCr of 1.04 mg/dL).   Medical History: Past Medical History:  Diagnosis Date  . Essential hypertension, benign    Assessment:  36 yom found to have severe 3-vessel CAD- plan for CABG on 10/23. Heparin level has been therapeutic on 1550 units/hr -Heparin level=  0.41, within goal range  Goal of Therapy:  Heparin level 0.3-0.7 units/ml Monitor platelets by anticoagulation protocol: Yes   Plan:  Continue heparin infusion at 1550 units/hr  Monitor daily heparin level, CBC Plans for CABG on 120/23  [name] [name] 02/14/2017 9:29 AM

## 2017-02-14 NOTE — Progress Notes (Signed)
4 Days Post-Op Procedure(s) (LRB): LEFT HEART CATH AND CORONARY ANGIOGRAPHY (N/A) Subjective: Coronary arteriograms, 2D echocardiogram images reviewed and discussed with patient Per CABG dopplers ok Plan CABG with bilat IMA grafts to RCA, LAD, poss saph vein grafts to diag, ramus  Objective: Vital signs in last 24 hours: Temp:  [98 F (36.7 C)-98.8 F (37.1 C)] 98.4 F (36.9 C) (10/22 1221) Pulse Rate:  [66-88] 82 (10/22 1400) Cardiac Rhythm: Normal sinus rhythm (10/22 1158) Resp:  [12-39] 39 (10/22 1500) BP: (106-135)/(59-92) 106/76 (10/22 1400) SpO2:  [97 %-100 %] 100 % (10/22 1400) Weight:  [216 lb 8 oz (98.2 kg)] 216 lb 8 oz (98.2 kg) (10/22 0500)  Hemodynamic parameters for last 24 hours:  stable  Intake/Output from previous day: 10/21 0701 - 10/22 0700 In: 1575 [P.O.:960; I.V.:615] Out: -  Intake/Output this shift: Total I/O In: 444 [P.O.:240; I.V.:204] Out: 700 [Urine:700]       Exam    General- alert and comfortable   Lungs- clear without rales, wheezes   Cor- regular rate and rhythm, no murmur , gallop   Abdomen- soft, non-tender   Extremities - warm, non-tender, minimal edema   Neuro- oriented, appropriate, no focal weakness   Lab Results:  Recent Labs  02/13/17 0415 02/14/17 0224  WBC 8.1 8.2  HGB 12.7* 12.7*  HCT 40.3 39.3  PLT 239 241   BMET:  Recent Labs  02/12/17 0301  NA 136  K 4.5  CL 102  CO2 25  GLUCOSE 94  BUN 12  CREATININE 1.04  CALCIUM 8.8*    PT/INR:  Recent Labs  02/12/17 0301  LABPROT 12.7  INR 0.96   ABG    Component Value Date/Time   PHART 7.433 02/11/2017 1019   HCO3 25.3 02/11/2017 1019   TCO2 26 02/11/2017 1019   O2SAT 97.0 02/11/2017 1019   CBG (last 3)  No results for input(s): GLUCAP in the last 72 hours.  Assessment/Plan: S/P Procedure(s) (LRB): LEFT HEART CATH AND CORONARY ANGIOGRAPHY (N/A) Plan CABG in am for severe CAD, recent nonstemi, mod LV dysfunctioin   LOS: 4 days    Kathlee Nations  Trigt III 02/14/2017

## 2017-02-14 NOTE — Telephone Encounter (Signed)
Spoke with Meriam Sprague at [facility]. She stated that she has tried contacting the pt to set up appt from referral, but pt is in the hospital. She said it is likely the hospital will have him follow up with [facility]. She wanted to know if she should keep the referral and try to schedule pt after he gets out or if she should disregard referral. Please advise. Meriam Sprague can be reached at [phone]. Thanks!

## 2017-02-14 NOTE — Progress Notes (Signed)
CT Surgery  Using the interpretor service # [phone] , I reviewed the details of CABG and the benefits of CABG and the risks of CABG and the expected recovery after CABG were reviewed with patient and all questions adressed P Donata Clay MD

## 2017-02-14 NOTE — Progress Notes (Addendum)
Pre-op Cardiac Surgery  Carotid Findings:   Findings are consistent with a 1-39 percent stenosis involving the right internal carotid artery and the left internal carotid artery. The vertebral arteries demonstrate antegrade flow.  Upper Extremity Right Left  Brachial Pressures 137  Triphasic 135  Triphasic  Radial Waveforms Triphasic Triphasic  Ulnar Waveforms Triphasic Triphasic  Palmar Arch (Allen's Test) Palmar waveforms remain within normal limits with radial compression and are diminished greater than fifty percent with ulnar compression. Palmar waveforms are diminished greater than fifty percent with radial compression and are obliterated with ulnar compression.    Lower  Extremity Right Left  Dorsalis Pedis 142 136  Posterior Tibial 164 163  Ankle/Brachial Indices 1.2 1.19   Findings:   Right ABI of 1.2 and left ABI of 1.19 are suggestive of arterial flow within normal limits at rest.  02/14/17 1:57 PM Olen Cordial RVT

## 2017-02-14 NOTE — Progress Notes (Signed)
Progress Note  Patient Name: [name] [name] Date of Encounter: 02/14/2017  Primary Cardiologist: Anne FuSkains  Subjective   No angina or dyspnea.  Inpatient Medications    Scheduled Meds: . aspirin EC  81 mg Oral Daily  . atorvastatin  80 mg Oral q1800  . chlorhexidine  60 mL Topical Once   And  . [START ON 02/15/2017] chlorhexidine  60 mL Topical Once  . [START ON 02/15/2017] chlorhexidine  15 mL Mouth/Throat Once  . Chlorhexidine Gluconate Cloth  6 each Topical Daily  . [START ON 02/15/2017] diazepam  5 mg Oral Once  . [START ON 02/15/2017] heparin-papaverine-plasmalyte irrigation   Irrigation To OR  . [START ON 02/15/2017] magnesium sulfate  40 mEq Other To OR  . metoprolol succinate  50 mg Oral Daily  . [START ON 02/15/2017] metoprolol tartrate  12.5 mg Oral Once  . mupirocin ointment  1 application Nasal BID  . [START ON 02/15/2017] potassium chloride  80 mEq Other To OR  . sodium chloride flush  3 mL Intravenous Q12H  . [START ON 02/15/2017] tranexamic acid  15 mg/kg Intravenous To OR  . [START ON 02/15/2017] tranexamic acid  2 mg/kg Intracatheter To OR   Continuous Infusions: . sodium chloride 10 mL/hr at 02/13/17 0456  . sodium chloride    . [START ON 02/15/2017] cefUROXime (ZINACEF)  IV    . [START ON 02/15/2017] cefUROXime (ZINACEF)  IV    . [START ON 02/15/2017] dexmedetomidine    . [START ON 02/15/2017] DOPamine    . [START ON 02/15/2017] epinephrine    . [START ON 02/15/2017] heparin 30,000 units/NS 1000 mL solution for CELLSAVER    . heparin 1,550 Units/hr (02/14/17 0931)  . [START ON 02/15/2017] insulin (NOVOLIN-R) infusion    . nitroGLYCERIN    . [START ON 02/15/2017] nitroGLYCERIN    . [START ON 02/15/2017] phenylephrine 20mg /21950mL NS (0.08mg /ml) infusion    . [START ON 02/15/2017] tranexamic acid (CYKLOKAPRON) infusion ([facility])    . [START ON 02/15/2017] vancomycin     PRN Meds: sodium chloride, acetaminophen, ALPRAZolam, nitroGLYCERIN,  ondansetron (ZOFRAN) IV, sodium chloride flush, temazepam   Vital Signs    Vitals:   02/14/17 0500 02/14/17 0600 02/14/17 0700 02/14/17 0742  BP: (!) 114/59 110/88 122/87   Pulse: 86 70 81   Resp: 19 16    Temp:    98.8 F (37.1 C)  TempSrc:    Oral  SpO2: 97% 98% 98%   Weight: 216 lb 8 oz (98.2 kg)     Height:        Intake/Output Summary (Last 24 hours) at 02/14/17 0947 Last data filed at 02/14/17 0700  Gross per 24 hour  Intake             1201 ml  Output                0 ml  Net             1201 ml   Filed Weights   02/11/17 0600 02/13/17 0500 02/14/17 0500  Weight: 220 lb 6.4 oz (100 kg) 217 lb 6.4 oz (98.6 kg) 216 lb 8 oz (98.2 kg)    Telemetry    NSR - Personally Reviewed  ECG    NSR, old inferoposterior MI, LVH with secondary changes - Personally Reviewed  Physical Exam  Relaxed, comfortable GEN: No acute distress.   Neck: No JVD Cardiac: RRR, no murmurs, rubs, or gallops.  Respiratory: Clear to auscultation bilaterally. GI:  Soft, nontender, non-distended  MS: No edema; No deformity. Neuro:  Nonfocal  Psych: Normal affect   Labs    Chemistry Recent Labs Lab 02/10/17 1252  02/10/17 1545 02/10/17 2138 02/11/17 0528 02/12/17 0301  NA 142  < > 141 138 139 136  K 4.8  --  2.8* 4.4 4.4 4.5  CL 102  --  110 105 105 102  CO2 23  --  22 26 27 25   GLUCOSE 90  < > 90 94 100* 94  BUN 12  < > 10 10 9 12   CREATININE 1.00  --  0.98 1.06 1.00 1.04  CALCIUM 9.7  --  7.3* 8.7* 8.8* 8.8*  PROT 7.8  --  5.7*  --   --  7.3  ALBUMIN 5.0  --  3.4*  --   --  4.1  AST 58*  --  40  --   --  31  ALT 50*  --  38  --   --  42  ALKPHOS 54  --  42  --   --  47  BILITOT 0.7  --  0.6  --   --  0.9  GFRNONAA 96  --  >60 >60 >60 >60  GFRAA 111  --  >60 >60 >60 >60  ANIONGAP  --   < > 9 7 7 9   < > = values in this interval not displayed.   Hematology Recent Labs Lab 02/12/17 0301 02/13/17 0415 02/14/17 0224  WBC 7.0 8.1 8.2  RBC 4.99 5.06 5.00  HGB 12.4* 12.7*  12.7*  HCT 39.9 40.3 39.3  MCV 80.0 79.6 78.6  MCH 24.8* 25.1* 25.4*  MCHC 31.1 31.5 32.3  RDW 17.3* 16.7* 16.4*  PLT 240 239 241    Cardiac Enzymes Recent Labs Lab 02/10/17 1130 02/10/17 1545  TROPONINI 10.67* 1.77*   No results for input(s): TROPIPOC in the last 168 hours.   BNPNo results for input(s): BNP, PROBNP in the last 168 hours.   DDimer No results for input(s): DDIMER in the last 168 hours.   Radiology    No results found.  Cardiac Studies   02/10/2017 CATH Diagnostic Diagram       02/12/2017 ECHO - Left ventricle: The cavity size was normal. There was moderate   concentric hypertrophy. Systolic function was moderately to   severely reduced. The estimated ejection fraction was in the   range of 30% to 35%. Diffuse hypokinesis. Doppler parameters are   consistent with abnormal left ventricular relaxation (grade 1   diastolic dysfunction). Doppler parameters are consistent with   indeterminate ventricular filling pressure. - Aortic valve: Transvalvular velocity was within the normal range.   There was no stenosis. There was no regurgitation. - Mitral valve: Transvalvular velocity was within the normal range.   There was no evidence for stenosis. There was mild regurgitation. - Left atrium: The atrium was mildly dilated. - Right ventricle: The cavity size was normal. Wall thickness was   normal. Systolic function was normal. - Atrial septum: No defect or patent foramen ovale was identified. - Tricuspid valve: There was trivial regurgitation. - Pulmonary arteries: Systolic pressure was within the normal   range. PA peak pressure: 22 mm Hg (S).    Patient Profile     36 y.o. male with premature onset severe multivessel CAD, NSTEMI and moderate LV dysfunction, awaiting CABG in AM.  Assessment & Plan    1. CAD: ECG suggests scar in the territory of the occluded LCX,  but may have substantial viability in other territories. No angina at rest. 2. CHF:  appears euvolemic. Good candidate for carvedilol and entresto postop (no insurance, will need drug assistance).  3. HTN: as above, +/- diuretic. Note hypokalemia on admission, raising concern for hyperaldosteronism. BP is very easily controlled on metoprolol only, makes this less likely.  For questions or updates, please contact [facility] Please consult [email or web address] for contact info under Cardiology/STEMI.      Signed, Thurmon Fair, MD  02/14/2017, 9:47 AM

## 2017-02-15 ENCOUNTER — Inpatient Hospital Stay (HOSPITAL_COMMUNITY): Payer: Self-pay

## 2017-02-15 ENCOUNTER — Inpatient Hospital Stay (HOSPITAL_COMMUNITY)
Admission: EM | Disposition: A | Discharge status: Home / Self Care | Payer: Self-pay | Source: Home / Self Care | Attending: Cardiothoracic Surgery

## 2017-02-15 ENCOUNTER — Inpatient Hospital Stay (HOSPITAL_COMMUNITY): Payer: Self-pay | Admitting: Certified Registered Nurse Anesthetist

## 2017-02-15 ENCOUNTER — Encounter (HOSPITAL_COMMUNITY): Payer: Self-pay | Admitting: Certified Registered Nurse Anesthetist

## 2017-02-15 DIAGNOSIS — Z951 Presence of aortocoronary bypass graft: Secondary | ICD-10-CM

## 2017-02-15 HISTORY — PX: CORONARY ARTERY BYPASS GRAFT: SHX141

## 2017-02-15 HISTORY — PX: TEE WITHOUT CARDIOVERSION: SHX5443

## 2017-02-15 LAB — CBC
HCT: 23.1 % — ABNORMAL LOW (ref 39.0–52.0)
HEMATOCRIT: 40.6 % (ref 39.0–52.0)
HEMATOCRIT: 25.2 % — AB (ref 39.0–52.0)
HEMOGLOBIN: 8.1 g/dL — AB (ref 13.0–17.0)
Hemoglobin: 13 g/dL (ref 13.0–17.0)
Hemoglobin: 7.4 g/dL — ABNORMAL LOW (ref 13.0–17.0)
MCH: 25 pg — ABNORMAL LOW (ref 26.0–34.0)
MCH: 24.9 pg — ABNORMAL LOW (ref 26.0–34.0)
MCH: 24.8 pg — ABNORMAL LOW (ref 26.0–34.0)
MCHC: 32 g/dL (ref 30.0–36.0)
MCHC: 32.1 g/dL (ref 30.0–36.0)
MCHC: 32 g/dL (ref 30.0–36.0)
MCV: 78.2 fL (ref 78.0–100.0)
MCV: 77.5 fL — ABNORMAL LOW (ref 78.0–100.0)
MCV: 77.5 fL — ABNORMAL LOW (ref 78.0–100.0)
Platelets: 261 10*3/uL (ref 150–400)
Platelets: 123 10*3/uL — ABNORMAL LOW (ref 150–400)
Platelets: 125 10*3/uL — ABNORMAL LOW (ref 150–400)
RBC: 5.19 MIL/uL (ref 4.22–5.81)
RBC: 3.25 MIL/uL — AB (ref 4.22–5.81)
RBC: 2.98 MIL/uL — ABNORMAL LOW (ref 4.22–5.81)
RDW: 16.4 % — ABNORMAL HIGH (ref 11.5–15.5)
RDW: 16.1 % — ABNORMAL HIGH (ref 11.5–15.5)
RDW: 16 % — ABNORMAL HIGH (ref 11.5–15.5)
WBC: 7.6 10*3/uL (ref 4.0–10.5)
WBC: 9.9 10*3/uL (ref 4.0–10.5)
WBC: 8.3 10*3/uL (ref 4.0–10.5)

## 2017-02-15 LAB — POCT I-STAT, CHEM 8
BUN: 15 mg/dL (ref 6–20)
BUN: 15 mg/dL (ref 6–20)
BUN: 14 mg/dL (ref 6–20)
BUN: 14 mg/dL (ref 6–20)
BUN: 14 mg/dL (ref 6–20)
BUN: 14 mg/dL (ref 6–20)
BUN: 13 mg/dL (ref 6–20)
BUN: 11 mg/dL (ref 6–20)
CALCIUM ION: 0.97 mmol/L — AB (ref 1.15–1.40)
CALCIUM ION: 0.92 mmol/L — AB (ref 1.15–1.40)
CHLORIDE: 101 mmol/L (ref 101–111)
CHLORIDE: 102 mmol/L (ref 101–111)
CHLORIDE: 104 mmol/L (ref 101–111)
CREATININE: 0.9 mg/dL (ref 0.61–1.24)
CREATININE: 0.9 mg/dL (ref 0.61–1.24)
CREATININE: 0.9 mg/dL (ref 0.61–1.24)
CREATININE: 0.9 mg/dL (ref 0.61–1.24)
Calcium, Ion: 1.18 mmol/L (ref 1.15–1.40)
Calcium, Ion: 1.33 mmol/L (ref 1.15–1.40)
Calcium, Ion: 0.97 mmol/L — ABNORMAL LOW (ref 1.15–1.40)
Calcium, Ion: 0.98 mmol/L — ABNORMAL LOW (ref 1.15–1.40)
Calcium, Ion: 0.83 mmol/L — CL (ref 1.15–1.40)
Calcium, Ion: 1.12 mmol/L — ABNORMAL LOW (ref 1.15–1.40)
Chloride: 103 mmol/L (ref 101–111)
Chloride: 103 mmol/L (ref 101–111)
Chloride: 96 mmol/L — ABNORMAL LOW (ref 101–111)
Chloride: 98 mmol/L — ABNORMAL LOW (ref 101–111)
Chloride: 103 mmol/L (ref 101–111)
Creatinine, Ser: 0.8 mg/dL (ref 0.61–1.24)
Creatinine, Ser: 1.1 mg/dL (ref 0.61–1.24)
Creatinine, Ser: 0.9 mg/dL (ref 0.61–1.24)
Creatinine, Ser: 0.8 mg/dL (ref 0.61–1.24)
GLUCOSE: 143 mg/dL — AB (ref 65–99)
GLUCOSE: 165 mg/dL — AB (ref 65–99)
GLUCOSE: 154 mg/dL — AB (ref 65–99)
GLUCOSE: 141 mg/dL — AB (ref 65–99)
Glucose, Bld: 102 mg/dL — ABNORMAL HIGH (ref 65–99)
Glucose, Bld: 162 mg/dL — ABNORMAL HIGH (ref 65–99)
Glucose, Bld: 127 mg/dL — ABNORMAL HIGH (ref 65–99)
Glucose, Bld: 124 mg/dL — ABNORMAL HIGH (ref 65–99)
HCT: 26 % — ABNORMAL LOW (ref 39.0–52.0)
HCT: 26 % — ABNORMAL LOW (ref 39.0–52.0)
HCT: 20 % — ABNORMAL LOW (ref 39.0–52.0)
HCT: 26 % — ABNORMAL LOW (ref 39.0–52.0)
HEMATOCRIT: 37 % — AB (ref 39.0–52.0)
HEMATOCRIT: 36 % — AB (ref 39.0–52.0)
HEMATOCRIT: 27 % — AB (ref 39.0–52.0)
HEMATOCRIT: 21 % — AB (ref 39.0–52.0)
HEMOGLOBIN: 12.6 g/dL — AB (ref 13.0–17.0)
HEMOGLOBIN: 12.2 g/dL — AB (ref 13.0–17.0)
HEMOGLOBIN: 8.8 g/dL — AB (ref 13.0–17.0)
HEMOGLOBIN: 7.1 g/dL — AB (ref 13.0–17.0)
Hemoglobin: 9.2 g/dL — ABNORMAL LOW (ref 13.0–17.0)
Hemoglobin: 8.8 g/dL — ABNORMAL LOW (ref 13.0–17.0)
Hemoglobin: 6.8 g/dL — CL (ref 13.0–17.0)
Hemoglobin: 8.8 g/dL — ABNORMAL LOW (ref 13.0–17.0)
POTASSIUM: 4.3 mmol/L (ref 3.5–5.1)
POTASSIUM: 4.5 mmol/L (ref 3.5–5.1)
POTASSIUM: 5.3 mmol/L — AB (ref 3.5–5.1)
POTASSIUM: 4.4 mmol/L (ref 3.5–5.1)
Potassium: 4 mmol/L (ref 3.5–5.1)
Potassium: 3.9 mmol/L (ref 3.5–5.1)
Potassium: 5.3 mmol/L — ABNORMAL HIGH (ref 3.5–5.1)
Potassium: 4.7 mmol/L (ref 3.5–5.1)
SODIUM: 139 mmol/L (ref 135–145)
SODIUM: 138 mmol/L (ref 135–145)
SODIUM: 138 mmol/L (ref 135–145)
SODIUM: 140 mmol/L (ref 135–145)
Sodium: 135 mmol/L (ref 135–145)
Sodium: 136 mmol/L (ref 135–145)
Sodium: 137 mmol/L (ref 135–145)
Sodium: 138 mmol/L (ref 135–145)
TCO2: 27 mmol/L (ref 22–32)
TCO2: 25 mmol/L (ref 22–32)
TCO2: 27 mmol/L (ref 22–32)
TCO2: 27 mmol/L (ref 22–32)
TCO2: 25 mmol/L (ref 22–32)
TCO2: 26 mmol/L (ref 22–32)
TCO2: 24 mmol/L (ref 22–32)
TCO2: 23 mmol/L (ref 22–32)

## 2017-02-15 LAB — PREPARE PLATELET PHERESIS: Unit division: 0

## 2017-02-15 LAB — GLUCOSE, CAPILLARY
GLUCOSE-CAPILLARY: 115 mg/dL — AB (ref 65–99)
GLUCOSE-CAPILLARY: 122 mg/dL — AB (ref 65–99)
GLUCOSE-CAPILLARY: 107 mg/dL — AB (ref 65–99)
GLUCOSE-CAPILLARY: 114 mg/dL — AB (ref 65–99)
GLUCOSE-CAPILLARY: 117 mg/dL — AB (ref 65–99)
GLUCOSE-CAPILLARY: 110 mg/dL — AB (ref 65–99)
Glucose-Capillary: 128 mg/dL — ABNORMAL HIGH (ref 65–99)
Glucose-Capillary: 121 mg/dL — ABNORMAL HIGH (ref 65–99)

## 2017-02-15 LAB — POCT I-STAT 3, VENOUS BLOOD GAS (G3P V)
BICARBONATE: 26 mmol/L (ref 20.0–28.0)
O2 SAT: 65 %
PCO2 VEN: 47 mmHg (ref 44.0–60.0)
PO2 VEN: 36 mmHg (ref 32.0–45.0)
TCO2: 27 mmol/L (ref 22–32)
pH, Ven: 7.35 (ref 7.250–7.430)

## 2017-02-15 LAB — POCT I-STAT 3, ART BLOOD GAS (G3+)
ACID-BASE DEFICIT: 1 mmol/L (ref 0.0–2.0)
Acid-Base Excess: 2 mmol/L (ref 0.0–2.0)
Acid-base deficit: 1 mmol/L (ref 0.0–2.0)
Acid-base deficit: 1 mmol/L (ref 0.0–2.0)
BICARBONATE: 27.5 mmol/L (ref 20.0–28.0)
BICARBONATE: 23.6 mmol/L (ref 20.0–28.0)
Bicarbonate: 25.2 mmol/L (ref 20.0–28.0)
Bicarbonate: 24 mmol/L (ref 20.0–28.0)
O2 SAT: 100 %
O2 Saturation: 100 %
O2 Saturation: 98 %
O2 Saturation: 98 %
PCO2 ART: 43.4 mmHg (ref 32.0–48.0)
PCO2 ART: 47.5 mmHg (ref 32.0–48.0)
PCO2 ART: 39.2 mmHg (ref 32.0–48.0)
PCO2 ART: 36.8 mmHg (ref 32.0–48.0)
PH ART: 7.409 (ref 7.350–7.450)
PH ART: 7.394 (ref 7.350–7.450)
PO2 ART: 406 mmHg — AB (ref 83.0–108.0)
PO2 ART: 113 mmHg — AB (ref 83.0–108.0)
PO2 ART: 113 mmHg — AB (ref 83.0–108.0)
Patient temperature: 37.9
TCO2: 29 mmol/L (ref 22–32)
TCO2: 27 mmol/L (ref 22–32)
TCO2: 25 mmol/L (ref 22–32)
TCO2: 25 mmol/L (ref 22–32)
pH, Arterial: 7.332 — ABNORMAL LOW (ref 7.350–7.450)
pH, Arterial: 7.42 (ref 7.350–7.450)
pO2, Arterial: 197 mmHg — ABNORMAL HIGH (ref 83.0–108.0)

## 2017-02-15 LAB — HEMOGLOBIN AND HEMATOCRIT, BLOOD
HCT: 26.8 % — ABNORMAL LOW (ref 39.0–52.0)
Hemoglobin: 8.6 g/dL — ABNORMAL LOW (ref 13.0–17.0)

## 2017-02-15 LAB — BASIC METABOLIC PANEL
Anion gap: 11 (ref 5–15)
BUN: 13 mg/dL (ref 6–20)
CO2: 23 mmol/L (ref 22–32)
Calcium: 9 mg/dL (ref 8.9–10.3)
Chloride: 102 mmol/L (ref 101–111)
Creatinine, Ser: 1.06 mg/dL (ref 0.61–1.24)
GFR calc Af Amer: >60 mL/min (ref >60)
GFR calc non Af Amer: >60 mL/min (ref >60)
Glucose, Bld: 98 mg/dL (ref 65–99)
Potassium: 3.9 mmol/L (ref 3.5–5.1)
Sodium: 136 mmol/L (ref 135–145)

## 2017-02-15 LAB — CREATININE, SERUM
Creatinine, Ser: 0.99 mg/dL (ref 0.61–1.24)
GFR calc Af Amer: >60 mL/min (ref >60)
GFR calc non Af Amer: >60 mL/min (ref >60)

## 2017-02-15 LAB — BPAM PLATELET PHERESIS
BLOOD PRODUCT EXPIRATION DATE: 201810242359
ISSUE DATE / TIME: 201810231137
UNIT TYPE AND RH: 7300

## 2017-02-15 LAB — POCT I-STAT 4, (NA,K, GLUC, HGB,HCT)
GLUCOSE: 116 mg/dL — AB (ref 65–99)
HEMATOCRIT: 22 % — AB (ref 39.0–52.0)
HEMOGLOBIN: 7.5 g/dL — AB (ref 13.0–17.0)
Potassium: 4.5 mmol/L (ref 3.5–5.1)
Sodium: 141 mmol/L (ref 135–145)

## 2017-02-15 LAB — TROPONIN I: Troponin I: 10.67 ng/mL (ref 0.00–0.04)

## 2017-02-15 LAB — PROTIME-INR
INR: 1.47
Prothrombin Time: 17.7 seconds — ABNORMAL HIGH (ref 11.4–15.2)

## 2017-02-15 LAB — MAGNESIUM: Magnesium: 2.5 mg/dL — ABNORMAL HIGH (ref 1.7–2.4)

## 2017-02-15 LAB — APTT: APTT: 27 s (ref 24–36)

## 2017-02-15 LAB — PREPARE RBC (CROSSMATCH)

## 2017-02-15 LAB — HEPARIN LEVEL (UNFRACTIONATED): Heparin Unfractionated: 0.4 IU/mL (ref 0.30–0.70)

## 2017-02-15 LAB — PLATELET COUNT: Platelets: 184 10*3/uL (ref 150–400)

## 2017-02-15 SURGERY — CORONARY ARTERY BYPASS GRAFTING (CABG)
Anesthesia: General | Site: Chest

## 2017-02-15 MED ORDER — SODIUM CHLORIDE 0.9 % IV SOLN
INTRAVENOUS | Status: DC
  Administered 2017-02-16: 5.3 [IU]/h via INTRAVENOUS
  Filled 2017-02-15 (×2): qty 1

## 2017-02-15 MED ORDER — BISACODYL 5 MG PO TBEC
10.0000 mg | DELAYED_RELEASE_TABLET | Freq: Every day | ORAL | Status: DC
  Administered 2017-02-16 – 2017-02-20 (×4): 10 mg via ORAL
  Filled 2017-02-15 (×4): qty 2

## 2017-02-15 MED ORDER — MIDAZOLAM HCL 5 MG/5ML IJ SOLN
INTRAMUSCULAR | Status: DC | PRN
  Administered 2017-02-15: 1 mg via INTRAVENOUS
  Administered 2017-02-15 (×2): 2 mg via INTRAVENOUS
  Administered 2017-02-15: 3 mg via INTRAVENOUS
  Administered 2017-02-15: 2 mg via INTRAVENOUS

## 2017-02-15 MED ORDER — CEFUROXIME SODIUM 1.5 G IV SOLR
1.5000 g | Freq: Two times a day (BID) | INTRAVENOUS | Status: AC
  Administered 2017-02-15 – 2017-02-17 (×4): 1.5 g via INTRAVENOUS
  Filled 2017-02-15 (×4): qty 1.5

## 2017-02-15 MED ORDER — KETOROLAC TROMETHAMINE 15 MG/ML IJ SOLN
15.0000 mg | Freq: Four times a day (QID) | INTRAMUSCULAR | Status: DC
  Administered 2017-02-15 – 2017-02-16 (×2): 15 mg via INTRAVENOUS
  Filled 2017-02-15 (×2): qty 1

## 2017-02-15 MED ORDER — TRAMADOL HCL 50 MG PO TABS: 50.0000 mg | ORAL_TABLET | ORAL | Status: DC | PRN

## 2017-02-15 MED ORDER — VANCOMYCIN HCL IN DEXTROSE 1-5 GM/200ML-% IV SOLN
1000.0000 mg | Freq: Two times a day (BID) | INTRAVENOUS | Status: AC
  Administered 2017-02-16 (×2): 1000 mg via INTRAVENOUS
  Filled 2017-02-15 (×2): qty 200

## 2017-02-15 MED ORDER — PROTAMINE SULFATE 10 MG/ML IV SOLN
INTRAVENOUS | Status: DC | PRN
  Administered 2017-02-15: 340 mg via INTRAVENOUS

## 2017-02-15 MED ORDER — ANTITHROMBIN III (HUMAN) 500 UNITS IV SOLR
500.0000 [IU] | INTRAVENOUS | Status: DC
  Filled 2017-02-15: qty 10

## 2017-02-15 MED ORDER — ACETAMINOPHEN 160 MG/5ML PO SOLN
1000.0000 mg | Freq: Four times a day (QID) | ORAL | Status: AC
  Administered 2017-02-15: 1000 mg
  Filled 2017-02-15: qty 40.6

## 2017-02-15 MED ORDER — HEPARIN SODIUM (PORCINE) 1000 UNIT/ML IJ SOLN
INTRAMUSCULAR | Status: AC
  Filled 2017-02-15: qty 1

## 2017-02-15 MED ORDER — LACTATED RINGERS IV SOLN: INTRAVENOUS | Status: DC

## 2017-02-15 MED ORDER — ORAL CARE MOUTH RINSE
15.0000 mL | OROMUCOSAL | Status: DC
  Administered 2017-02-15 (×3): 15 mL via OROMUCOSAL

## 2017-02-15 MED ORDER — LACTATED RINGERS IV SOLN
INTRAVENOUS | Status: DC | PRN
  Administered 2017-02-15 (×2): via INTRAVENOUS

## 2017-02-15 MED ORDER — DEXMEDETOMIDINE HCL IN NACL 200 MCG/50ML IV SOLN
INTRAVENOUS | Status: AC
  Filled 2017-02-15: qty 50

## 2017-02-15 MED ORDER — DOCUSATE SODIUM 100 MG PO CAPS
200.0000 mg | ORAL_CAPSULE | Freq: Every day | ORAL | Status: DC
  Administered 2017-02-16 – 2017-02-21 (×6): 200 mg via ORAL
  Filled 2017-02-15 (×6): qty 2

## 2017-02-15 MED ORDER — METOPROLOL TARTRATE 5 MG/5ML IV SOLN
2.5000 mg | INTRAVENOUS | Status: DC | PRN
  Administered 2017-02-16 (×4): 5 mg via INTRAVENOUS
  Filled 2017-02-15 (×4): qty 5

## 2017-02-15 MED ORDER — MIDAZOLAM HCL 2 MG/2ML IJ SOLN
2.0000 mg | INTRAMUSCULAR | Status: DC | PRN
  Administered 2017-02-15: 2 mg via INTRAVENOUS
  Filled 2017-02-15 (×2): qty 2

## 2017-02-15 MED ORDER — ACETAMINOPHEN 650 MG RE SUPP
650.0000 mg | Freq: Once | RECTAL | Status: AC
  Administered 2017-02-15: 650 mg via RECTAL

## 2017-02-15 MED ORDER — ASPIRIN EC 325 MG PO TBEC
325.0000 mg | DELAYED_RELEASE_TABLET | Freq: Every day | ORAL | Status: DC
  Administered 2017-02-16 – 2017-02-21 (×6): 325 mg via ORAL
  Filled 2017-02-15 (×6): qty 1

## 2017-02-15 MED ORDER — ANTITHROMBIN III (HUMAN) 500 UNITS IV SOLR
615.0000 [IU] | INTRAVENOUS | Status: DC
  Filled 2017-02-15: qty 12.3

## 2017-02-15 MED ORDER — SODIUM CHLORIDE 0.9% FLUSH: 3.0000 mL | INTRAVENOUS | Status: DC | PRN

## 2017-02-15 MED ORDER — ROCURONIUM BROMIDE 10 MG/ML (PF) SYRINGE
PREFILLED_SYRINGE | INTRAVENOUS | Status: AC
  Filled 2017-02-15: qty 10

## 2017-02-15 MED ORDER — OXYCODONE HCL 5 MG PO TABS
5.0000 mg | ORAL_TABLET | ORAL | Status: DC | PRN
  Administered 2017-02-16 – 2017-02-20 (×6): 10 mg via ORAL
  Filled 2017-02-15 (×6): qty 2

## 2017-02-15 MED ORDER — SODIUM CHLORIDE 0.9 % IV SOLN: Freq: Once | INTRAVENOUS | Status: DC

## 2017-02-15 MED ORDER — LACTATED RINGERS IV SOLN
INTRAVENOUS | Status: DC
  Administered 2017-02-16: via INTRAVENOUS

## 2017-02-15 MED ORDER — HEMOSTATIC AGENTS (NO CHARGE) OPTIME
TOPICAL | Status: DC | PRN
  Administered 2017-02-15 (×3): 1 via TOPICAL

## 2017-02-15 MED ORDER — SODIUM CHLORIDE 0.9 % IV SOLN: INTRAVENOUS | Status: DC

## 2017-02-15 MED ORDER — INSULIN REGULAR BOLUS VIA INFUSION: 0.0000 [IU] | Freq: Three times a day (TID) | INTRAVENOUS | Status: DC

## 2017-02-15 MED ORDER — POTASSIUM CHLORIDE 10 MEQ/100ML IV SOLN: 10.0000 meq | INTRAVENOUS | Status: AC

## 2017-02-15 MED ORDER — MILRINONE LACTATE IN DEXTROSE 20-5 MG/100ML-% IV SOLN
0.1250 ug/kg/min | INTRAVENOUS | Status: AC
  Administered 2017-02-15: 0.25 ug/kg/min via INTRAVENOUS
  Filled 2017-02-15: qty 100

## 2017-02-15 MED ORDER — FENTANYL CITRATE (PF) 250 MCG/5ML IJ SOLN
INTRAMUSCULAR | Status: AC
  Filled 2017-02-15: qty 25

## 2017-02-15 MED ORDER — LACTATED RINGERS IV SOLN
INTRAVENOUS | Status: DC | PRN
  Administered 2017-02-15: 07:00:00 via INTRAVENOUS

## 2017-02-15 MED ORDER — MORPHINE SULFATE (PF) 4 MG/ML IV SOLN
2.0000 mg | INTRAVENOUS | Status: DC | PRN
  Administered 2017-02-16 (×3): 4 mg via INTRAVENOUS
  Filled 2017-02-15 (×4): qty 1

## 2017-02-15 MED ORDER — PHENYLEPHRINE HCL 10 MG/ML IJ SOLN
INTRAVENOUS | Status: DC | PRN
  Administered 2017-02-15: 15 ug/min via INTRAVENOUS

## 2017-02-15 MED ORDER — ONDANSETRON HCL 4 MG/2ML IJ SOLN
4.0000 mg | Freq: Four times a day (QID) | INTRAMUSCULAR | Status: DC | PRN
  Filled 2017-02-15: qty 2

## 2017-02-15 MED ORDER — NITROGLYCERIN 0.2 MG/ML ON CALL CATH LAB
INTRAVENOUS | Status: DC | PRN
  Administered 2017-02-15 (×3): 80 ug via INTRAVENOUS
  Administered 2017-02-15 (×2): 40 ug via INTRAVENOUS

## 2017-02-15 MED ORDER — MIDAZOLAM HCL 2 MG/2ML IJ SOLN
INTRAMUSCULAR | Status: AC
  Filled 2017-02-15: qty 2

## 2017-02-15 MED ORDER — FAMOTIDINE IN NACL 20-0.9 MG/50ML-% IV SOLN
20.0000 mg | Freq: Two times a day (BID) | INTRAVENOUS | Status: AC
  Administered 2017-02-15 (×2): 20 mg via INTRAVENOUS
  Filled 2017-02-15: qty 50

## 2017-02-15 MED ORDER — PHENYLEPHRINE HCL 10 MG/ML IJ SOLN
INTRAMUSCULAR | Status: DC | PRN
  Administered 2017-02-15: 40 ug via INTRAVENOUS
  Administered 2017-02-15 (×2): 80 ug via INTRAVENOUS
  Administered 2017-02-15: 120 ug via INTRAVENOUS
  Administered 2017-02-15 (×3): 80 ug via INTRAVENOUS
  Administered 2017-02-15: 40 ug via INTRAVENOUS

## 2017-02-15 MED ORDER — ACETAMINOPHEN 500 MG PO TABS
1000.0000 mg | ORAL_TABLET | Freq: Four times a day (QID) | ORAL | Status: AC
  Administered 2017-02-16 – 2017-02-20 (×18): 1000 mg via ORAL
  Filled 2017-02-15 (×18): qty 2

## 2017-02-15 MED ORDER — PHENYLEPHRINE HCL 10 MG/ML IJ SOLN
0.0000 ug/min | INTRAMUSCULAR | Status: DC
  Filled 2017-02-15 (×2): qty 2

## 2017-02-15 MED ORDER — METOCLOPRAMIDE HCL 5 MG/ML IJ SOLN
10.0000 mg | Freq: Four times a day (QID) | INTRAMUSCULAR | Status: AC
  Administered 2017-02-15 – 2017-02-16 (×3): 10 mg via INTRAVENOUS
  Filled 2017-02-15 (×4): qty 2

## 2017-02-15 MED ORDER — ASPIRIN 81 MG PO CHEW
324.0000 mg | CHEWABLE_TABLET | Freq: Every day | ORAL | Status: DC
  Filled 2017-02-15: qty 4

## 2017-02-15 MED ORDER — VANCOMYCIN HCL IN DEXTROSE 1-5 GM/200ML-% IV SOLN
1000.0000 mg | Freq: Once | INTRAVENOUS | Status: DC
  Filled 2017-02-15: qty 200

## 2017-02-15 MED ORDER — SODIUM CHLORIDE 0.9% FLUSH
3.0000 mL | Freq: Two times a day (BID) | INTRAVENOUS | Status: DC
  Administered 2017-02-16 – 2017-02-20 (×10): 3 mL via INTRAVENOUS

## 2017-02-15 MED ORDER — ALBUMIN HUMAN 5 % IV SOLN
250.0000 mL | INTRAVENOUS | Status: AC | PRN
  Administered 2017-02-15 (×4): 250 mL via INTRAVENOUS
  Filled 2017-02-15 (×2): qty 250

## 2017-02-15 MED ORDER — DEXMEDETOMIDINE HCL IN NACL 400 MCG/100ML IV SOLN
0.0000 ug/kg/h | INTRAVENOUS | Status: DC
  Administered 2017-02-15: 0.7 ug/kg/h via INTRAVENOUS
  Filled 2017-02-15: qty 100

## 2017-02-15 MED ORDER — MIDAZOLAM HCL 10 MG/2ML IJ SOLN
INTRAMUSCULAR | Status: AC
  Filled 2017-02-15: qty 2

## 2017-02-15 MED ORDER — PHENYLEPHRINE 40 MCG/ML (10ML) SYRINGE FOR IV PUSH (FOR BLOOD PRESSURE SUPPORT)
PREFILLED_SYRINGE | INTRAVENOUS | Status: AC
  Filled 2017-02-15: qty 10

## 2017-02-15 MED ORDER — SODIUM CHLORIDE 0.45 % IV SOLN
INTRAVENOUS | Status: DC | PRN
Start: 2017-02-15 — End: 2017-02-21

## 2017-02-15 MED ORDER — SODIUM CHLORIDE 0.9 % IV SOLN
250.0000 mL | INTRAVENOUS | Status: DC
  Administered 2017-02-16: 250 mL via INTRAVENOUS

## 2017-02-15 MED ORDER — DESMOPRESSIN ACETATE 4 MCG/ML IJ SOLN
20.0000 ug | INTRAMUSCULAR | Status: AC
  Administered 2017-02-15: 20 ug via INTRAVENOUS
  Filled 2017-02-15: qty 5

## 2017-02-15 MED ORDER — ALBUMIN HUMAN 5 % IV SOLN
INTRAVENOUS | Status: DC | PRN
  Administered 2017-02-15 (×3): via INTRAVENOUS

## 2017-02-15 MED ORDER — BISACODYL 10 MG RE SUPP: 10.0000 mg | Freq: Every day | RECTAL | Status: DC

## 2017-02-15 MED ORDER — SODIUM CHLORIDE 0.9 % IJ SOLN
OROMUCOSAL | Status: DC | PRN
  Administered 2017-02-15 (×3): 4 mL via TOPICAL

## 2017-02-15 MED ORDER — NOREPINEPHRINE BITARTRATE 1 MG/ML IV SOLN
0.0000 ug/min | INTRAVENOUS | Status: DC
  Administered 2017-02-15: 3 ug/min via INTRAVENOUS
  Filled 2017-02-15: qty 4

## 2017-02-15 MED ORDER — PROPOFOL 10 MG/ML IV BOLUS
INTRAVENOUS | Status: AC
  Filled 2017-02-15: qty 20

## 2017-02-15 MED ORDER — CALCIUM CHLORIDE 10 % IV SOLN
INTRAVENOUS | Status: DC | PRN
  Administered 2017-02-15: 200 mg via INTRAVENOUS
  Administered 2017-02-15: 300 mg via INTRAVENOUS
  Administered 2017-02-15: 200 mg via INTRAVENOUS
  Administered 2017-02-15: 100 mg via INTRAVENOUS
  Administered 2017-02-15: 200 mg via INTRAVENOUS

## 2017-02-15 MED ORDER — MILRINONE LACTATE IN DEXTROSE 20-5 MG/100ML-% IV SOLN
0.1250 ug/kg/min | INTRAVENOUS | Status: DC
  Administered 2017-02-15: 0.25 ug/kg/min via INTRAVENOUS
  Administered 2017-02-16: 0.3 ug/kg/min via INTRAVENOUS
  Administered 2017-02-17: 0.125 ug/kg/min via INTRAVENOUS
  Administered 2017-02-17: 0.25 ug/kg/min via INTRAVENOUS
  Filled 2017-02-15 (×4): qty 100

## 2017-02-15 MED ORDER — FENTANYL CITRATE (PF) 250 MCG/5ML IJ SOLN
INTRAMUSCULAR | Status: AC
  Filled 2017-02-15: qty 5

## 2017-02-15 MED ORDER — ACETAMINOPHEN 160 MG/5ML PO SOLN: 650.0000 mg | Freq: Once | ORAL | Status: AC

## 2017-02-15 MED ORDER — HEPARIN SODIUM (PORCINE) 1000 UNIT/ML IJ SOLN
INTRAMUSCULAR | Status: DC | PRN
  Administered 2017-02-15: 34000 [IU] via INTRAVENOUS

## 2017-02-15 MED ORDER — THROMBIN (RECOMBINANT) 5000 UNITS EX SOLR
CUTANEOUS | Status: AC
  Filled 2017-02-15: qty 5000

## 2017-02-15 MED ORDER — METOPROLOL TARTRATE 25 MG/10 ML ORAL SUSPENSION: 12.5000 mg | Freq: Two times a day (BID) | ORAL | Status: DC

## 2017-02-15 MED ORDER — FENTANYL CITRATE (PF) 100 MCG/2ML IJ SOLN
INTRAMUSCULAR | Status: DC | PRN
  Administered 2017-02-15: 500 ug via INTRAVENOUS
  Administered 2017-02-15 (×3): 50 ug via INTRAVENOUS
  Administered 2017-02-15: 200 ug via INTRAVENOUS
  Administered 2017-02-15: 50 ug via INTRAVENOUS
  Administered 2017-02-15: 250 ug via INTRAVENOUS
  Administered 2017-02-15: 150 ug via INTRAVENOUS

## 2017-02-15 MED ORDER — SODIUM CHLORIDE 0.9 % IV SOLN
INTRAVENOUS | Status: DC | PRN
  Administered 2017-02-15: 13:00:00 via INTRAVENOUS

## 2017-02-15 MED ORDER — NOREPINEPHRINE BITARTRATE 1 MG/ML IV SOLN: 0.0000 ug/min | INTRAVENOUS | Status: DC

## 2017-02-15 MED ORDER — LACTATED RINGERS IV SOLN: 500.0000 mL | Freq: Once | INTRAVENOUS | Status: DC | PRN

## 2017-02-15 MED ORDER — PANTOPRAZOLE SODIUM 40 MG PO TBEC
40.0000 mg | DELAYED_RELEASE_TABLET | Freq: Every day | ORAL | Status: DC
  Administered 2017-02-17 – 2017-02-21 (×5): 40 mg via ORAL
  Filled 2017-02-15 (×5): qty 1

## 2017-02-15 MED ORDER — CALCIUM CHLORIDE 10 % IV SOLN
INTRAVENOUS | Status: AC
  Filled 2017-02-15: qty 10

## 2017-02-15 MED ORDER — CHLORHEXIDINE GLUCONATE 0.12% ORAL RINSE (MEDLINE KIT)
15.0000 mL | Freq: Two times a day (BID) | OROMUCOSAL | Status: DC
  Administered 2017-02-15: 15 mL via OROMUCOSAL

## 2017-02-15 MED ORDER — NITROGLYCERIN IN D5W 200-5 MCG/ML-% IV SOLN: 0.0000 ug/min | INTRAVENOUS | Status: DC

## 2017-02-15 MED ORDER — THROMBIN (RECOMBINANT) 5000 UNITS EX SOLR
CUTANEOUS | Status: DC | PRN
  Administered 2017-02-15: 5000 [IU] via TOPICAL

## 2017-02-15 MED ORDER — METOPROLOL TARTRATE 12.5 MG HALF TABLET
12.5000 mg | ORAL_TABLET | Freq: Two times a day (BID) | ORAL | Status: DC
  Administered 2017-02-16: 12.5 mg via ORAL
  Filled 2017-02-15: qty 1

## 2017-02-15 MED ORDER — PHENYLEPHRINE HCL 10 MG/ML IJ SOLN
INTRAMUSCULAR | Status: AC
  Filled 2017-02-15: qty 1

## 2017-02-15 MED ORDER — 0.9 % SODIUM CHLORIDE (POUR BTL) OPTIME
TOPICAL | Status: DC | PRN
  Administered 2017-02-15: 5000 mL

## 2017-02-15 MED ORDER — MAGNESIUM SULFATE 4 GM/100ML IV SOLN
4.0000 g | Freq: Once | INTRAVENOUS | Status: AC
  Administered 2017-02-15: 4 g via INTRAVENOUS
  Filled 2017-02-15: qty 100

## 2017-02-15 MED ORDER — CALCIUM CHLORIDE 10 % IV SOLN
1.0000 g | Freq: Once | INTRAVENOUS | Status: AC
  Administered 2017-02-15: 1 g via INTRAVENOUS

## 2017-02-15 MED ORDER — MORPHINE SULFATE (PF) 4 MG/ML IV SOLN
1.0000 mg | INTRAVENOUS | Status: AC | PRN
  Administered 2017-02-15: 4 mg via INTRAVENOUS
  Filled 2017-02-15: qty 1

## 2017-02-15 MED ORDER — NITROGLYCERIN IN D5W 200-5 MCG/ML-% IV SOLN
INTRAVENOUS | Status: DC | PRN
  Administered 2017-02-15: 5 ug/min via INTRAVENOUS

## 2017-02-15 MED ORDER — CHLORHEXIDINE GLUCONATE 0.12 % MT SOLN
15.0000 mL | OROMUCOSAL | Status: AC
  Administered 2017-02-15: 15 mL via OROMUCOSAL

## 2017-02-15 MED ORDER — LACTATED RINGERS IV SOLN
INTRAVENOUS | Status: DC | PRN
  Administered 2017-02-15 (×2): via INTRAVENOUS

## 2017-02-15 MED ORDER — ROCURONIUM BROMIDE 100 MG/10ML IV SOLN
INTRAVENOUS | Status: DC | PRN
Start: 2017-02-15 — End: 2017-02-15
  Administered 2017-02-15 (×3): 50 mg via INTRAVENOUS
  Administered 2017-02-15: 100 mg via INTRAVENOUS

## 2017-02-15 MED ORDER — NOREPINEPHRINE BITARTRATE 1 MG/ML IV SOLN
0.0000 ug/min | INTRAVENOUS | Status: DC
  Filled 2017-02-15 (×2): qty 4

## 2017-02-15 MED ORDER — PROPOFOL 10 MG/ML IV BOLUS
INTRAVENOUS | Status: DC | PRN
Start: 2017-02-15 — End: 2017-02-15
  Administered 2017-02-15 (×2): 50 mg via INTRAVENOUS

## 2017-02-15 MED ORDER — EPINEPHRINE PF 1 MG/10ML IJ SOSY
PREFILLED_SYRINGE | INTRAMUSCULAR | Status: AC
  Filled 2017-02-15: qty 10

## 2017-02-15 MED FILL — Sodium Chloride IV Soln 0.9%: INTRAVENOUS | Qty: 2000 | Status: AC

## 2017-02-15 MED FILL — Mannitol IV Soln 20%: INTRAVENOUS | Qty: 500 | Status: AC

## 2017-02-15 MED FILL — Heparin Sodium (Porcine) Inj 1000 Unit/ML: INTRAMUSCULAR | Qty: 30 | Status: AC

## 2017-02-15 MED FILL — Sodium Bicarbonate IV Soln 8.4%: INTRAVENOUS | Qty: 50 | Status: AC

## 2017-02-15 MED FILL — Electrolyte-R (PH 7.4) Solution: INTRAVENOUS | Qty: 5000 | Status: AC

## 2017-02-15 MED FILL — Lidocaine HCl IV Inj 20 MG/ML: INTRAVENOUS | Qty: 5 | Status: AC

## 2017-02-15 MED FILL — Albumin, Human Inj 5%: INTRAVENOUS | Qty: 250 | Status: AC

## 2017-02-15 SURGICAL SUPPLY — 108 items
ADAPTER CARDIO PERF ANTE/RETRO (ADAPTER) ×4 IMPLANT
APPLICATOR COTTON TIP 6IN STRL (MISCELLANEOUS) ×4 IMPLANT
BAG DECANTER FOR FLEXI CONT (MISCELLANEOUS) ×4 IMPLANT
BANDAGE ACE 4X5 VEL STRL LF (GAUZE/BANDAGES/DRESSINGS) ×4 IMPLANT
BANDAGE ACE 6X5 VEL STRL LF (GAUZE/BANDAGES/DRESSINGS) ×4 IMPLANT
BASKET HEART  (ORDER IN 25'S) (MISCELLANEOUS) ×1
BASKET HEART (ORDER IN 25'S) (MISCELLANEOUS) ×1
BASKET HEART (ORDER IN 25S) (MISCELLANEOUS) ×2 IMPLANT
BLADE CLIPPER SURG (BLADE) ×4 IMPLANT
BLADE MINI RND TIP GREEN BEAV (BLADE) ×4 IMPLANT
BLADE STERNUM SYSTEM 6 (BLADE) ×4 IMPLANT
BLADE SURG 11 STRL SS (BLADE) ×4 IMPLANT
BLADE SURG 12 STRL SS (BLADE) ×4 IMPLANT
BNDG GAUZE ELAST 4 BULKY (GAUZE/BANDAGES/DRESSINGS) ×4 IMPLANT
CANISTER SUCT 3000ML PPV (MISCELLANEOUS) ×4 IMPLANT
CANNULA GUNDRY RCSP 15FR (MISCELLANEOUS) ×4 IMPLANT
CATH CPB KIT VANTRIGT (MISCELLANEOUS) ×4 IMPLANT
CATH ROBINSON RED A/P 18FR (CATHETERS) ×12 IMPLANT
CATH THORACIC 36FR RT ANG (CATHETERS) ×4 IMPLANT
CLIP FOGARTY SPRING 6M (CLIP) ×4 IMPLANT
CLIP VESOCCLUDE SM WIDE 24/CT (CLIP) ×8 IMPLANT
COVER SURGICAL LIGHT HANDLE (MISCELLANEOUS) ×4 IMPLANT
CRADLE DONUT ADULT HEAD (MISCELLANEOUS) ×4 IMPLANT
DERMABOND ADVANCED (GAUZE/BANDAGES/DRESSINGS) ×2
DERMABOND ADVANCED .7 DNX12 (GAUZE/BANDAGES/DRESSINGS) ×2 IMPLANT
DRAIN CHANNEL 32F RND 10.7 FF (WOUND CARE) ×4 IMPLANT
DRAPE CARDIOVASCULAR INCISE (DRAPES) ×2
DRAPE SLUSH/WARMER DISC (DRAPES) ×4 IMPLANT
DRAPE SRG 135X102X78XABS (DRAPES) ×2 IMPLANT
DRSG AQUACEL AG ADV 3.5X14 (GAUZE/BANDAGES/DRESSINGS) ×4 IMPLANT
ELECT BLADE 4.0 EZ CLEAN MEGAD (MISCELLANEOUS) ×4
ELECT BLADE 6.5 EXT (BLADE) ×4 IMPLANT
ELECT CAUTERY BLADE 6.4 (BLADE) ×4 IMPLANT
ELECT REM PT RETURN 9FT ADLT (ELECTROSURGICAL) ×8
ELECTRODE BLDE 4.0 EZ CLN MEGD (MISCELLANEOUS) ×2 IMPLANT
ELECTRODE REM PT RTRN 9FT ADLT (ELECTROSURGICAL) ×4 IMPLANT
FELT TEFLON 1X6 (MISCELLANEOUS) ×4 IMPLANT
GAUZE SPONGE 4X4 12PLY STRL (GAUZE/BANDAGES/DRESSINGS) ×8 IMPLANT
GLOVE BIO SURGEON STRL SZ7.5 (GLOVE) ×12 IMPLANT
GLOVE BIOGEL PI IND STRL 6 (GLOVE) ×6 IMPLANT
GLOVE BIOGEL PI INDICATOR 6 (GLOVE) ×6
GOWN STRL REUS W/ TWL LRG LVL3 (GOWN DISPOSABLE) ×16 IMPLANT
GOWN STRL REUS W/TWL LRG LVL3 (GOWN DISPOSABLE) ×16
HEMOSTAT POWDER SURGIFOAM 1G (HEMOSTASIS) ×12 IMPLANT
HEMOSTAT SURGICEL 2X14 (HEMOSTASIS) ×4 IMPLANT
INSERT FOGARTY XLG (MISCELLANEOUS) IMPLANT
KIT BASIN OR (CUSTOM PROCEDURE TRAY) ×4 IMPLANT
KIT ROOM TURNOVER OR (KITS) ×4 IMPLANT
KIT SUCTION CATH 14FR (SUCTIONS) ×4 IMPLANT
KIT VASOVIEW HEMOPRO VH 3000 (KITS) ×4 IMPLANT
LEAD PACING MYOCARDI (MISCELLANEOUS) ×4 IMPLANT
MARKER GRAFT CORONARY BYPASS (MISCELLANEOUS) ×16 IMPLANT
NS IRRIG 1000ML POUR BTL (IV SOLUTION) ×24 IMPLANT
PACK OPEN HEART (CUSTOM PROCEDURE TRAY) ×4 IMPLANT
PAD ARMBOARD 7.5X6 YLW CONV (MISCELLANEOUS) ×8 IMPLANT
PAD ELECT DEFIB RADIOL ZOLL (MISCELLANEOUS) ×4 IMPLANT
PENCIL BUTTON HOLSTER BLD 10FT (ELECTRODE) ×4 IMPLANT
POWDER SURGICEL 3.0 GRAM (HEMOSTASIS) ×4 IMPLANT
PUNCH AORTIC ROTATE  4.5MM 8IN (MISCELLANEOUS) ×4 IMPLANT
PUNCH AORTIC ROTATE 4.0MM (MISCELLANEOUS) IMPLANT
PUNCH AORTIC ROTATE 4.5MM 8IN (MISCELLANEOUS) IMPLANT
PUNCH AORTIC ROTATE 5MM 8IN (MISCELLANEOUS) IMPLANT
SET CARDIOPLEGIA MPS 5001102 (MISCELLANEOUS) ×4 IMPLANT
SOLUTION ANTI FOG 6CC (MISCELLANEOUS) ×4 IMPLANT
SPONGE LAP 4X18 X RAY DECT (DISPOSABLE) ×4 IMPLANT
SURGIFLO W/THROMBIN 8M KIT (HEMOSTASIS) ×4 IMPLANT
SUT BONE WAX W31G (SUTURE) ×4 IMPLANT
SUT ETHIBOND 2 0 SH (SUTURE) ×8
SUT ETHIBOND 2 0 SH 36X2 (SUTURE) ×8 IMPLANT
SUT MNCRL AB 4-0 PS2 18 (SUTURE) ×4 IMPLANT
SUT PROLENE 3 0 SH DA (SUTURE) ×4 IMPLANT
SUT PROLENE 3 0 SH1 36 (SUTURE) IMPLANT
SUT PROLENE 4 0 RB 1 (SUTURE) ×8
SUT PROLENE 4 0 SH DA (SUTURE) ×8 IMPLANT
SUT PROLENE 4-0 RB1 .5 CRCL 36 (SUTURE) ×8 IMPLANT
SUT PROLENE 5 0 C 1 36 (SUTURE) ×24 IMPLANT
SUT PROLENE 6 0 C 1 30 (SUTURE) ×20 IMPLANT
SUT PROLENE 6 0 CC (SUTURE) ×16 IMPLANT
SUT PROLENE 8 0 BV175 6 (SUTURE) ×12 IMPLANT
SUT PROLENE BLUE 7 0 (SUTURE) ×8 IMPLANT
SUT SILK  1 MH (SUTURE) ×6
SUT SILK 1 MH (SUTURE) ×6 IMPLANT
SUT SILK 1 TIES 10X30 (SUTURE) ×4 IMPLANT
SUT SILK 2 0 SH CR/8 (SUTURE) ×16 IMPLANT
SUT SILK 2 0 TIES 10X30 (SUTURE) ×4 IMPLANT
SUT SILK 2 0 TIES 17X18 (SUTURE) ×2
SUT SILK 2-0 18XBRD TIE BLK (SUTURE) ×2 IMPLANT
SUT SILK 3 0 SH CR/8 (SUTURE) ×8 IMPLANT
SUT SILK 4 0 TIE 10X30 (SUTURE) ×8 IMPLANT
SUT STEEL 6MS V (SUTURE) ×12 IMPLANT
SUT STEEL SZ 6 DBL 3X14 BALL (SUTURE) ×4 IMPLANT
SUT TEM PAC WIRE 2 0 SH (SUTURE) ×16 IMPLANT
SUT VIC AB 1 CTX 36 (SUTURE) ×4
SUT VIC AB 1 CTX36XBRD ANBCTR (SUTURE) ×4 IMPLANT
SUT VIC AB 2-0 CT1 27 (SUTURE) ×2
SUT VIC AB 2-0 CT1 TAPERPNT 27 (SUTURE) ×2 IMPLANT
SUT VIC AB 2-0 CTX 27 (SUTURE) ×8 IMPLANT
SUT VIC AB 3-0 X1 27 (SUTURE) ×8 IMPLANT
SUTURE E-PAK OPEN HEART (SUTURE) IMPLANT
SYSTEM SAHARA CHEST DRAIN ATS (WOUND CARE) ×4 IMPLANT
TOWEL GREEN STERILE (TOWEL DISPOSABLE) ×8 IMPLANT
TOWEL GREEN STERILE FF (TOWEL DISPOSABLE) ×8 IMPLANT
TOWEL OR 17X24 6PK STRL BLUE (TOWEL DISPOSABLE) IMPLANT
TOWEL OR 17X26 10 PK STRL BLUE (TOWEL DISPOSABLE) IMPLANT
TRAY FOLEY SILVER 16FR TEMP (SET/KITS/TRAYS/PACK) ×4 IMPLANT
TUBING INSUFFLATION (TUBING) ×4 IMPLANT
UNDERPAD 30X30 (UNDERPADS AND DIAPERS) ×4 IMPLANT
WATER STERILE IRR 1000ML POUR (IV SOLUTION) ×8 IMPLANT

## 2017-02-15 NOTE — Progress Notes (Signed)
T[facility] BRIEF SICU PROGRESS NOTE  Day of Surgery  S/P Procedure(s) (LRB): CORONARY ARTERY BYPASS GRAFTING (CABG), ON PUMP, TIMES FOUR, USING LEFT INTERNAL MAMMARY ARTERY AND ENDOSCOPICALLY HARVESTED RIGHT GREATER SAPHENOUS VEIN (N/A) TRANSESOPHAGEAL ECHOCARDIOGRAM (TEE) (N/A)   Waking up on vent NSR w/ stable hemodynamics on milrinone and levophed O2 sats 100% on 50% FiO2 Chest tube output low UOP adequate Labs okay  Plan: Continue routine early postop  Purcell Nails, MD 02/15/2017 8:14 PM

## 2017-02-15 NOTE — Anesthesia Preprocedure Evaluation (Addendum)
Anesthesia Evaluation  Patient identified by MRN, date of birth, ID band Patient awake    Reviewed: Allergy & Precautions, NPO status , Patient's Chart, lab work & pertinent test results, reviewed documented beta blocker date and time   History of Anesthesia Complications Negative for: history of anesthetic complications  Airway Mallampati: II  TM Distance: >3 FB Neck ROM: Full    Dental  (+) Dental Advisory Given, Teeth Intact   Pulmonary neg pulmonary ROS,    breath sounds clear to auscultation       Cardiovascular hypertension, Pt. on medications (-) angina+ CAD (severe 3v ASCAD) and + Past MI   Rhythm:Regular Rate:Normal  02/12/17 ECHO: EF 30% to 35%. Diffuse hypokinesis, mild MR   Neuro/Psych negative neurological ROS     GI/Hepatic Neg liver ROS, GERD  Medicated and Controlled,  Endo/Other  Morbid obesity  Renal/GU negative Renal ROS     Musculoskeletal   Abdominal (+) + obese,   Peds  Hematology negative hematology ROS (+)   Anesthesia Other Findings   Reproductive/Obstetrics                            Anesthesia Physical Anesthesia Plan  ASA: IV  Anesthesia Plan: General   Post-op Pain Management:    Induction: Intravenous  PONV Risk Score and Plan: Treatment may vary due to age or medical condition  Airway Management Planned: Oral ETT  Additional Equipment: Arterial line, PA Cath, TEE and Ultrasound Guidance Line Placement  Intra-op Plan:   Post-operative Plan: Post-operative intubation/ventilation  Informed Consent: I have reviewed the patients History and Physical, chart, labs and discussed the procedure including the risks, benefits and alternatives for the proposed anesthesia with the patient or authorized representative who has indicated his/her understanding and acceptance.   Dental advisory given  Plan Discussed with: CRNA and Surgeon  Anesthesia Plan  Comments: (Plan routine monitors, A line, PA cath, GETA with TEE and post op ventilation)        Anesthesia Quick Evaluation

## 2017-02-15 NOTE — Brief Op Note (Signed)
02/10/2017 - 02/15/2017  12:15 PM  PATIENT:  [name] [name]  36 y.o. male  PRE-OPERATIVE DIAGNOSIS:  CAD  POST-OPERATIVE DIAGNOSIS:  CAD  PROCEDURE:  Procedure(s):  CORONARY ARTERY BYPASS GRAFTING x 4 -LIMA to LAD -SVG to DIAGONAL -SVG to RAMUS INTERMEDIATE -SVG to RCA  ENDOSCOPIC HARVEST GREATER SAPHENOUS VEIN -Right Leg  TRANSESOPHAGEAL ECHOCARDIOGRAM (TEE) (N/A)  SURGEON:  Surgeon(s) and Role:    Kerin Perna, MD - Primary  PHYSICIAN ASSISTANT: Lowella Dandy PA-C  ANESTHESIA:   general  EBL:    BLOOD ADMINISTERED: CELLSAVER, 2 FFP and 1 PLTS  DRAINS: Left Pleural Chest Tubes Mediastinal Chest drains   LOCAL MEDICATIONS USED:  NONE  SPECIMEN:  No Specimen  DISPOSITION OF SPECIMEN:  N/A  COUNTS:  YES  TOURNIQUET:  * No tourniquets in log *  DICTATION: .Dragon Dictation  PLAN OF CARE: Admit to inpatient   PATIENT DISPOSITION:  ICU - intubated and hemodynamically stable.   Delay start of Pharmacological VTE agent (>24hrs) due to surgical blood loss or risk of bleeding: yes

## 2017-02-15 NOTE — OR Nursing (Signed)
Twenty minute call to SICU charge nurse at 1406. Spoke to [place]. Cath lab also notified of timing.

## 2017-02-15 NOTE — Progress Notes (Signed)
Attempted to wean pt. ABG looked ok. Pt unable to pass niff and vital capacity. Poor effort by pt on both. Will allow pt to rest for addition hour and attempt to wean again.

## 2017-02-15 NOTE — Telephone Encounter (Signed)
Please cancel this referral. Thanks!

## 2017-02-15 NOTE — Transfer of Care (Signed)
Immediate Anesthesia Transfer of Care Note  Patient: [name] [name]  Procedure(s) Performed: CORONARY ARTERY BYPASS GRAFTING (CABG), ON PUMP, TIMES FOUR, USING LEFT INTERNAL MAMMARY ARTERY AND ENDOSCOPICALLY HARVESTED RIGHT GREATER SAPHENOUS VEIN (N/A Chest) TRANSESOPHAGEAL ECHOCARDIOGRAM (TEE) (N/A )  Patient Location: ICU  Anesthesia Type:General  Level of Consciousness: sedated and Patient remains intubated per anesthesia plan  Airway & Oxygen Therapy: Patient remains intubated per anesthesia plan and Patient placed on Ventilator (see vital sign flow sheet for setting)  Post-op Assessment: Report given to RN and Post -op Vital signs reviewed and stable  Post vital signs: Reviewed and stable  Last Vitals:  Vitals:   02/15/17 0500 02/15/17 0550  BP: 136/90 133/88  Pulse: 76 78  Resp: (!) 22 20  Temp:    SpO2: 97% 99%    Last Pain:  Vitals:   02/14/17 2038  TempSrc: Oral  PainSc:          Complications: No apparent anesthesia complications

## 2017-02-15 NOTE — Telephone Encounter (Signed)
Left Meriam Sprague a message to cancel referral. Thanks!

## 2017-02-15 NOTE — Anesthesia Postprocedure Evaluation (Signed)
Anesthesia Post Note  Patient: Retail banker  Procedure(s) Performed: CORONARY ARTERY BYPASS GRAFTING (CABG), ON PUMP, TIMES FOUR, USING LEFT INTERNAL MAMMARY ARTERY AND ENDOSCOPICALLY HARVESTED RIGHT GREATER SAPHENOUS VEIN (N/A Chest) TRANSESOPHAGEAL ECHOCARDIOGRAM (TEE) (N/A )     Patient location during evaluation: SICU Anesthesia Type: General Level of consciousness: sedated, patient remains intubated per anesthesia plan, responds to stimulation and patient cooperative Pain management: pain level controlled Vital Signs Assessment: post-procedure vital signs reviewed and stable Respiratory status: patient on ventilator - see flowsheet for VS, patient remains intubated per anesthesia plan and respiratory function stable (preparing for extubation) Cardiovascular status: stable Postop Assessment: no apparent nausea or vomiting Anesthetic complications: no    Last Vitals:  Vitals:   02/15/17 1700 02/15/17 1715  BP: 115/78   Pulse: 78 82  Resp: 17 19  Temp: 36.6 C 36.6 C  SpO2: 100% 100%    Last Pain:  Vitals:   02/14/17 2038  TempSrc: Oral  PainSc:                  Edras Wilford,E. Dravon Nott

## 2017-02-15 NOTE — Progress Notes (Signed)
  Echocardiogram Echocardiogram Transesophageal has been performed.  Roosvelt Maser F 02/15/2017, 1:36 PM

## 2017-02-15 NOTE — Anesthesia Procedure Notes (Addendum)
Procedure Name: Intubation Date/Time: 02/15/2017 8:05 AM Performed by: Annabelle Harman A Pre-anesthesia Checklist: Patient identified, Emergency Drugs available, Suction available and Patient being monitored Patient Re-evaluated:Patient Re-evaluated prior to induction Oxygen Delivery Method: Circle system utilized Preoxygenation: Pre-oxygenation with 100% oxygen Induction Type: IV induction Ventilation: Mask ventilation without difficulty, Oral airway inserted - appropriate to patient size and Two handed mask ventilation required Laryngoscope Size: Miller and 2 Grade View: Grade I Tube type: Oral Tube size: 8.0 mm Number of attempts: 1 Airway Equipment and Method: Stylet Placement Confirmation: ETT inserted through vocal cords under direct vision,  breath sounds checked- equal and bilateral and positive ETCO2 Secured at: 23 cm Tube secured with: Tape Dental Injury: Teeth and Oropharynx as per pre-operative assessment

## 2017-02-15 NOTE — Progress Notes (Signed)
RT second attempt to wean patient but he was unable to pass parameters. Patient flipped back to full support. RT and RN to try again later.

## 2017-02-15 NOTE — Op Note (Signed)
NAMDoyle Askew:  [name], [name]    ACCOUNT NO.:  [id number]  MEDICAL RECORD NO.:  [id number]  LOCATION:  2H15C                        FACILITY:  [name]  PHYSICIAN:  Kerin PernaPeter Van [name], M.D.  DATE OF BIRTH:  08-16-1980  DATE OF PROCEDURE:  02/15/2017 DATE OF DISCHARGE:                              OPERATIVE REPORT   PROCEDURE PERFORMED: 1. Coronary artery bypass grafting x4 (left internal mammary artery to     LAD, saphenous vein graft to diagonal, saphenous vein graft to     ramus intermediate, saphenous vein graft to right coronary artery). 2. Endoscopic harvest of right leg greater saphenous vein.  SURGEON:  Kerin PernaPeter Van [name], M.D.  ASSISTANT:  [name] DandyErin Barrett, PA-C.  ANESTHESIA:  General by Dr. Jairo Benarswell [name].  PREOPERATIVE DIAGNOSES:  Non-ST-elevation myocardial infarction, moderate-to-severe LV dysfunction, severe 3-vessel coronary artery disease.  POSTOPERATIVE DIAGNOSES:  Non-ST-elevation myocardial infarction, moderate-to-severe LV dysfunction, severe 3-vessel coronary artery disease.  CLINICAL NOTE:  The patient is a 36 year old Hispanic male presented to the hospital with chest pains and positive cardiac enzymes. Echocardiogram showed EF of 30%, consistent with moderate-severe LV dysfunction.  He underwent cardiac catheterization which demonstrated severe multivessel coronary artery disease with chronically occluded LAD, 95% stenosis of the mid RCA, and occluded circumflex.  LVEDP was 19.  He was stabilized with heparin and surgical consultation was requested for coronary artery bypass grafting.  I examined the patient in the ICU and agreed with the recommendation for CABG as his best long- term therapy.  Through an interpreter, I discussed the details of CABG with the patient including the indications, benefits, alternatives, and expected recovery.  Also through the interpretive service, I carefully outlined and described the potential complications to him of  CABG including those risks of stroke, MI, bleeding, blood transfusion requirement, infection, postoperative pulmonary problems including pleural effusion, and death.  After reviewing these issues, he demonstrated his understanding, agreed to proceed with surgery under what I felt was an informed consent.  OPERATIVE FINDINGS: 1. Poor targets for grafting - the RCA was filled with soft plaque and     the LAD was small diffusely diseased vessel, chronically occluded.     The diagonal and ramus vessels were also small 1.2-mm vessel. 2. Acute cardiogenic shock shortly after induction of anesthesia,     requiring crashing on cardiopulmonary bypass and left IMA harvest     while on bypass. 3. Significant improvement in global LV function after separation from     cardiopulmonary bypass after CABG x4.  DESCRIPTION OF PROCEDURE:  The patient was brought to the operating room and placed supine on the operating table.  General anesthesia was induced after informed consent had been obtained, and the patient was checked in the preop holding area.  A transesophageal echo probe was placed by the Anesthesia team.  The EF was 30%.  There was no mitral regurgitation.  The patient was prepped and draped as a sterile field. A proper time-out was performed.  A sternal incision was made as the saphenous vein was being harvested from the right leg with endoscopic technique.  At this point, the patient's systolic blood pressure dropped in the 50-60 range, and his PA pressures increased dramatically up to the 70-80 range.  We proceeded  with expeditious sternotomy exposure of the heart and pursestrings placed in the ascending aorta and right atrium.  Heparin was being administered.  The patient was treated with nitroglycerin by the Anesthesia team, which improved hemodynamics until we could get on bypass.  The patient did not need chest compressions.  The patient was cannulated and placed on cardiopulmonary  bypass.  The left IMA was harvested as a pedicle graft from its origin at the subclavian vessel vessels.  There was a 1.5-mm vessel with good flow.  The sternal retractor was then replaced carefully around the arterial and venous cannula.  The coronaries were identified for grafting.  The coronary vessels were poor targets, but graftable.  The cardioplegia cannulas were placed both antegrade and retrograde coronary sinus cold blood cardioplegia.  The patient was cooled to 32 degrees and an aortic crossclamp was applied.  One liter of cold blood cardioplegia was delivered in split doses between the antegrade aortic and retrograde coronary sinus catheters.  There was good cardioplegic arrest and septal temperature dropped less than 12 degrees.  Cardioplegia was delivered every 20 minutes.  The first distal anastomosis was to the distal main RCA.  This was a 1.75 mm vessel with a proximal 95% stenosis.  The vessel was opened. There was thick soft plaque.  The lumen was identified, however, and the anastomosis was carried out with a reverse saphenous vein using running 7-0 Prolene with good flow through the graft.  Cardioplegia was redosed.  The second distal anastomosis was to the ramus intermediate branch of the left coronary.  This was a 1.2-mm vessel with proximal 90% stenosis. It went intramyocardial.  A reverse saphenous vein was sewn end-to-side with running 7-0 Prolene.  There was good flow through the graft. Cardioplegia was redosed.  The third distal anastomosis was to the second diagonal branch to the LAD.  This was a 1.2-mm vessel with a proximal 90% stenosis.  A reverse saphenous vein was sewn end-to-side with running 7-0 Prolene.  There was good flow through the graft.  Cardioplegia was redosed.  The fourth distal anastomosis was the distal third of the LAD.  It was occluded proximally.  The left IMA pedicle was brought through an opening in the left lateral pericardium  was brought down onto the LAD and sewn end-to-side with a running 8-0 Prolene.  There was excellent flow through the anastomosis after briefly releasing the pedicle bulldog on the mammary artery.  The bulldog was reapplied.  The pedicle was secured to epicardium with 6-0 Prolene sutures.  Cardioplegia was redosed.  While the crossclamp was still in place, 3 proximal vein anastomoses were performed on the ascending aorta with a 4.5-mm punch running 6-0 Prolene.  Prior to removing the crossclamp, air was vented from the coronaries with a dose of retrograde warm blood cardioplegia.  The crossclamp was removed and the heart resumed a spontaneous rhythm.  The vein grafts were de-aired and opened.  Each had good flow and hemostasis was documented at the proximal distal anastomoses.  The patient was rewarmed and reperfused.  Temporary pacing wires were applied.  The lungs were expanded.  The ventilator was resumed.  The patient was weaned off cardiopulmonary bypass on low-dose milrinone and Neo-Synephrine.  Cardiac output was 4-5 L/minute.  Echo showed significant improvement in global LV function.  Heparin was reversed with protamine without adverse reaction.  The cannulas were removed. The mediastinum was irrigated.  The superior pericardial fat was closed. There was still diffuse coagulopathic type bleeding  and the patient was given 2 units of FFP with improved coagulation function.  The anterior mediastinum and left pleural space were drained with chest tubes and brought out through separate incisions and secured to the skin.  The sternum was closed with interrupted steel wire.  The patient remained stable.  The pectoralis fascia was closed with a running #1 Vicryl.  The subcutaneous and skin layers were closed in running Vicryl and sterile dressings were applied.  Total cardiopulmonary bypass time was 200 minutes.     Kerin Perna, M.D.     PV/MEDQ  D:  02/15/2017  T:   02/15/2017  Job:  [id number]

## 2017-02-15 NOTE — Anesthesia Procedure Notes (Signed)
Central Venous Catheter Insertion Performed by: Annye Asa, anesthesiologist Start/End10/23/2018 7:16 AM, 02/15/2017 7:29 AM Patient location: Pre-op. Preanesthetic checklist: patient identified, IV checked, risks and benefits discussed, surgical consent, monitors and equipment checked, pre-op evaluation, timeout performed and anesthesia consent Position: Trendelenburg Lidocaine 1% used for infiltration and patient sedated Hand hygiene performed , maximum sterile barriers used  and Seldinger technique used Catheter size: 8.5 Fr PA cath was placed.Sheath introducer Swan type:thermodilution Procedure performed using ultrasound guided technique. Ultrasound Notes:anatomy identified, needle tip was noted to be adjacent to the nerve/plexus identified, no ultrasound evidence of intravascular and/or intraneural injection and image(s) printed for medical record Attempts: 1 Following insertion, line sutured, dressing applied and Biopatch. Post procedure assessment: blood return through all ports, free fluid flow and no air  Patient tolerated the procedure well with no immediate complications. Additional procedure comments: PA catheter:  Routine monitors. Timeout, sterile prep, drape, FBP R neck.  Trendelenburg position.  1% Lido local, finder and trocar RIJ 1st pass with US guidance.  Cordis placed over J wire. PA catheter in easily.  Sterile dressing applied.  Patient tolerated well, VSS.  Jenita Seashore, MD.

## 2017-02-15 NOTE — OR Nursing (Signed)
Forty-five minute call to SICU charge nurse at 1321. Spoke to [place].

## 2017-02-15 NOTE — Progress Notes (Signed)
Pre Procedure note for inpatients:   Pearlie Negrete-Ramirez has been scheduled for Procedure(s): CORONARY ARTERY BYPASS GRAFTING (CABG) (N/A) TRANSESOPHAGEAL ECHOCARDIOGRAM (TEE) (N/A) today. The various methods of treatment have been discussed with the patient. After consideration of the risks, benefits and treatment options the patient has consented to the planned procedure.   The patient has been seen and labs reviewed. There are no changes in the patient's condition to prevent proceeding with the planned procedure today.  Recent labs:  Lab Results  Component Value Date   WBC 7.6 02/15/2017   HGB 13.0 02/15/2017   HCT 40.6 02/15/2017   PLT 261 02/15/2017   GLUCOSE 98 02/15/2017   CHOL 233 (H) 02/10/2017   TRIG 209 (H) 02/10/2017   HDL 38 (L) 02/10/2017   LDLCALC 153 (H) 02/10/2017   ALT 42 02/12/2017   AST 31 02/12/2017   NA 136 02/15/2017   K 3.9 02/15/2017   CL 102 02/15/2017   CREATININE 1.06 02/15/2017   BUN 13 02/15/2017   CO2 23 02/15/2017   TSH 4.770 (H) 02/10/2017   INR 0.96 02/12/2017   HGBA1C 5.8 (H) 02/12/2017    Mikey Bussing, MD 02/15/2017 7:39 AM

## 2017-02-15 NOTE — Anesthesia Procedure Notes (Signed)
Arterial Line Insertion Start/End10/23/2018 6:40 AM, 02/15/2017 6:45 AM Performed by: Waynard Edwards, CRNA  Patient location: Pre-op. Preanesthetic checklist: patient identified, IV checked, site marked, risks and benefits discussed, surgical consent, monitors and equipment checked, pre-op evaluation, timeout performed and anesthesia consent Lidocaine 1% used for infiltration Right, radial was placed Catheter size: 20 G Hand hygiene performed , maximum sterile barriers used  and Seldinger technique used Allen's test indicative of satisfactory collateral circulation Attempts: 1 Procedure performed without using ultrasound guided technique. Following insertion, dressing applied and Biopatch. Post procedure assessment: normal and unchanged  Patient tolerated the procedure well with no immediate complications.

## 2017-02-16 ENCOUNTER — Inpatient Hospital Stay (HOSPITAL_COMMUNITY): Payer: Self-pay

## 2017-02-16 ENCOUNTER — Encounter (HOSPITAL_COMMUNITY): Payer: Self-pay | Admitting: Cardiothoracic Surgery

## 2017-02-16 DIAGNOSIS — Z951 Presence of aortocoronary bypass graft: Secondary | ICD-10-CM

## 2017-02-16 DIAGNOSIS — E7800 Pure hypercholesterolemia, unspecified: Secondary | ICD-10-CM

## 2017-02-16 LAB — CREATININE, SERUM
Creatinine, Ser: 1.11 mg/dL (ref 0.61–1.24)
GFR calc non Af Amer: >60 mL/min (ref >60)

## 2017-02-16 LAB — GLUCOSE, CAPILLARY
GLUCOSE-CAPILLARY: 112 mg/dL — AB (ref 65–99)
GLUCOSE-CAPILLARY: 118 mg/dL — AB (ref 65–99)
GLUCOSE-CAPILLARY: 120 mg/dL — AB (ref 65–99)
GLUCOSE-CAPILLARY: 125 mg/dL — AB (ref 65–99)
GLUCOSE-CAPILLARY: 140 mg/dL — AB (ref 65–99)
GLUCOSE-CAPILLARY: 86 mg/dL (ref 65–99)
Glucose-Capillary: 125 mg/dL — ABNORMAL HIGH (ref 65–99)
Glucose-Capillary: 148 mg/dL — ABNORMAL HIGH (ref 65–99)
Glucose-Capillary: 148 mg/dL — ABNORMAL HIGH (ref 65–99)
Glucose-Capillary: 129 mg/dL — ABNORMAL HIGH (ref 65–99)
Glucose-Capillary: 107 mg/dL — ABNORMAL HIGH (ref 65–99)
Glucose-Capillary: 110 mg/dL — ABNORMAL HIGH (ref 65–99)
Glucose-Capillary: 122 mg/dL — ABNORMAL HIGH (ref 65–99)
Glucose-Capillary: 130 mg/dL — ABNORMAL HIGH (ref 65–99)

## 2017-02-16 LAB — BASIC METABOLIC PANEL
Anion gap: 8 (ref 5–15)
BUN: 10 mg/dL (ref 6–20)
CHLORIDE: 105 mmol/L (ref 101–111)
CO2: 23 mmol/L (ref 22–32)
CREATININE: 1.02 mg/dL (ref 0.61–1.24)
Calcium: 7.7 mg/dL — ABNORMAL LOW (ref 8.9–10.3)
GFR calc Af Amer: >60 mL/min (ref >60)
GFR calc non Af Amer: >60 mL/min (ref >60)
Glucose, Bld: 143 mg/dL — ABNORMAL HIGH (ref 65–99)
Potassium: 4.1 mmol/L (ref 3.5–5.1)
SODIUM: 136 mmol/L (ref 135–145)

## 2017-02-16 LAB — POCT I-STAT 3, ART BLOOD GAS (G3+)
ACID-BASE DEFICIT: 1 mmol/L (ref 0.0–2.0)
ACID-BASE EXCESS: 3 mmol/L — AB (ref 0.0–2.0)
BICARBONATE: 27.3 mmol/L (ref 20.0–28.0)
Bicarbonate: 23.8 mmol/L (ref 20.0–28.0)
O2 SAT: 99 %
O2 Saturation: 99 %
PCO2 ART: 37.7 mmHg (ref 32.0–48.0)
PH ART: 7.411 (ref 7.350–7.450)
Patient temperature: 37.5
TCO2: 25 mmol/L (ref 22–32)
TCO2: 28 mmol/L (ref 22–32)
pCO2 arterial: 37.8 mmHg (ref 32.0–48.0)
pH, Arterial: 7.468 — ABNORMAL HIGH (ref 7.350–7.450)
pO2, Arterial: 116 mmHg — ABNORMAL HIGH (ref 83.0–108.0)
pO2, Arterial: 111 mmHg — ABNORMAL HIGH (ref 83.0–108.0)

## 2017-02-16 LAB — PREPARE FRESH FROZEN PLASMA
UNIT DIVISION: 0
Unit division: 0

## 2017-02-16 LAB — MAGNESIUM
MAGNESIUM: 2.1 mg/dL (ref 1.7–2.4)
MAGNESIUM: 2.1 mg/dL (ref 1.7–2.4)

## 2017-02-16 LAB — CBC
HCT: 19.5 % — ABNORMAL LOW (ref 39.0–52.0)
HCT: 27.8 % — ABNORMAL LOW (ref 39.0–52.0)
HEMOGLOBIN: 6.4 g/dL — AB (ref 13.0–17.0)
HEMOGLOBIN: 9.2 g/dL — AB (ref 13.0–17.0)
MCH: 25.1 pg — AB (ref 26.0–34.0)
MCH: 26.2 pg (ref 26.0–34.0)
MCHC: 32.8 g/dL (ref 30.0–36.0)
MCHC: 33.1 g/dL (ref 30.0–36.0)
MCV: 76.5 fL — ABNORMAL LOW (ref 78.0–100.0)
MCV: 79.2 fL (ref 78.0–100.0)
PLATELETS: 138 10*3/uL — AB (ref 150–400)
Platelets: 120 10*3/uL — ABNORMAL LOW (ref 150–400)
RBC: 2.55 MIL/uL — ABNORMAL LOW (ref 4.22–5.81)
RBC: 3.51 MIL/uL — ABNORMAL LOW (ref 4.22–5.81)
RDW: 16.3 % — ABNORMAL HIGH (ref 11.5–15.5)
RDW: 16 % — ABNORMAL HIGH (ref 11.5–15.5)
WBC: 7.4 10*3/uL (ref 4.0–10.5)
WBC: 7.9 10*3/uL (ref 4.0–10.5)

## 2017-02-16 LAB — BPAM FFP
BLOOD PRODUCT EXPIRATION DATE: 201810242359
Blood Product Expiration Date: 201810242359
ISSUE DATE / TIME: 201810231137
ISSUE DATE / TIME: 201810231137
Unit Type and Rh: 600
Unit Type and Rh: 6200

## 2017-02-16 LAB — PREPARE RBC (CROSSMATCH)

## 2017-02-16 MED ORDER — FUROSEMIDE 10 MG/ML IJ SOLN
40.0000 mg | Freq: Two times a day (BID) | INTRAMUSCULAR | Status: DC
  Administered 2017-02-16 – 2017-02-17 (×4): 40 mg via INTRAVENOUS
  Filled 2017-02-16 (×4): qty 4

## 2017-02-16 MED ORDER — INSULIN DETEMIR 100 UNIT/ML ~~LOC~~ SOLN
12.0000 [IU] | Freq: Two times a day (BID) | SUBCUTANEOUS | Status: DC
  Administered 2017-02-16 – 2017-02-19 (×8): 12 [IU] via SUBCUTANEOUS
  Filled 2017-02-16 (×9): qty 0.12

## 2017-02-16 MED ORDER — LISINOPRIL 10 MG PO TABS
10.0000 mg | ORAL_TABLET | Freq: Every day | ORAL | Status: DC
  Administered 2017-02-16 – 2017-02-17 (×2): 10 mg via ORAL
  Filled 2017-02-16 (×2): qty 1

## 2017-02-16 MED ORDER — ORAL CARE MOUTH RINSE
15.0000 mL | Freq: Two times a day (BID) | OROMUCOSAL | Status: DC
  Administered 2017-02-16 – 2017-02-21 (×9): 15 mL via OROMUCOSAL

## 2017-02-16 MED ORDER — METOLAZONE 5 MG PO TABS
5.0000 mg | ORAL_TABLET | Freq: Once | ORAL | Status: AC
  Administered 2017-02-16: 5 mg via ORAL
  Filled 2017-02-16: qty 1

## 2017-02-16 MED ORDER — METOPROLOL TARTRATE 25 MG PO TABS
25.0000 mg | ORAL_TABLET | Freq: Two times a day (BID) | ORAL | Status: DC
  Administered 2017-02-16 – 2017-02-21 (×10): 25 mg via ORAL
  Filled 2017-02-16 (×10): qty 1

## 2017-02-16 MED ORDER — INSULIN ASPART 100 UNIT/ML ~~LOC~~ SOLN
0.0000 [IU] | SUBCUTANEOUS | Status: DC
  Administered 2017-02-16 – 2017-02-17 (×4): 2 [IU] via SUBCUTANEOUS

## 2017-02-16 MED FILL — Magnesium Sulfate Inj 50%: INTRAMUSCULAR | Qty: 10 | Status: AC

## 2017-02-16 MED FILL — Potassium Chloride Inj 2 mEq/ML: INTRAVENOUS | Qty: 40 | Status: AC

## 2017-02-16 MED FILL — Heparin Sodium (Porcine) Inj 1000 Unit/ML: INTRAMUSCULAR | Qty: 30 | Status: AC

## 2017-02-16 NOTE — Progress Notes (Signed)
1 Day Post-Op Procedure(s) (LRB): CORONARY ARTERY BYPASS GRAFTING (CABG), ON PUMP, TIMES FOUR, USING LEFT INTERNAL MAMMARY ARTERY AND ENDOSCOPICALLY HARVESTED RIGHT GREATER SAPHENOUS VEIN (N/A) TRANSESOPHAGEAL ECHOCARDIOGRAM (TEE) (N/A) Subjective: Progressing after nonstemi, CABG x4, acute cardiogenic shock after sternal incision requiring urgent application cardio-pulmonary bypass  Neuro intact Coagulopathy- postop expected blood loss anemia  Objective: Vital signs in last 24 hours: Temp:  [97.2 F (36.2 C)-100.4 F (38 C)] 98.2 F (36.8 C) (10/24 0700) Pulse Rate:  [78-118] 100 (10/24 0700) Cardiac Rhythm: Normal sinus rhythm (10/24 0400) Resp:  [10-29] 24 (10/24 0700) BP: (85-136)/(49-85) 121/69 (10/24 0700) SpO2:  [97 %-100 %] 100 % (10/24 0700) Arterial Line BP: (83-176)/(43-75) 119/69 (10/24 0700) FiO2 (%):  [40 %-50 %] 40 % (10/23 2304) Weight:  [232 lb 12.8 oz (105.6 kg)] 232 lb 12.8 oz (105.6 kg) (10/24 0500)  Hemodynamic parameters for last 24 hours: PAP: (18-36)/(6-20) 20/8 CO:  [3.6 L/min-6.1 L/min] 5.7 L/min CI:  [1.7 L/min/m2-2.9 L/min/m2] 2.7 L/min/m2  Intake/Output from previous day: 10/23 0701 - 10/24 0700 In: 8347.9 [I.V.:4890.9; Blood:932; NG/GT:150; IV Piggyback:2375] Out: 4825 [Urine:3465; Emesis/NG output:80; Blood:500; Chest Tube:580] Intake/Output this shift: No intake/output data recorded.       Exam    General- alert and comfortable   Lungs- clear without rales, wheezes   Cor- regular rate and rhythm, no murmur , gallop   Abdomen- soft, non-tender   Extremities - warm, non-tender, minimal edema   Neuro- oriented, appropriate, no focal weakness   Lab Results:  Recent Labs  02/15/17 2043 02/15/17 2050 02/16/17 0342  WBC 8.3  --  7.4  HGB 7.4* 7.1* 6.4*  HCT 23.1* 21.0* 19.5*  PLT 125*  --  120*   BMET:  Recent Labs  02/15/17 0227  02/15/17 2050 02/16/17 0342  NA 136  < > 138 136  K 3.9  < > 4.4 4.1  CL 102  < > 104 105  CO2  23  --   --  23  GLUCOSE 98  < > 124* 143*  BUN 13  < > 11 10  CREATININE 1.06  < > 0.90 1.02  CALCIUM 9.0  --   --  7.7*  < > = values in this interval not displayed.  PT/INR:  Recent Labs  02/15/17 1447  LABPROT 17.7*  INR 1.47   ABG    Component Value Date/Time   PHART 7.468 (H) 02/16/2017 0200   HCO3 27.3 02/16/2017 0200   TCO2 28 02/16/2017 0200   ACIDBASEDEF 1.0 02/16/2017 0057   O2SAT 99.0 02/16/2017 0200   CBG (last 3)   Recent Labs  02/16/17 0501 02/16/17 0552 02/16/17 0649  GLUCAP 129* 125* 107*    Assessment/Plan: S/P Procedure(s) (LRB): CORONARY ARTERY BYPASS GRAFTING (CABG), ON PUMP, TIMES FOUR, USING LEFT INTERNAL MAMMARY ARTERY AND ENDOSCOPICALLY HARVESTED RIGHT GREATER SAPHENOUS VEIN (N/A) TRANSESOPHAGEAL ECHOCARDIOGRAM (TEE) (N/A) Mobilize Diuresis Diabetes control See progression orders transfusion therapy for Hct 19%   LOS: 6 days    Kathlee Nations Trigt III 02/16/2017

## 2017-02-16 NOTE — Progress Notes (Signed)
Progress Note  Patient Name: [name] [name] Date of Encounter: 02/16/2017  Primary Cardiologist: Anne Fu  Subjective   Day 1 after CABG 4 (LIMA to LAD, SVG to ramus intermedius, SVG to diagonal, SVG to RCA). Had cardiogenic shock immediately following sternal incision, but left ventricular systolic function showed marked improvement by the end of his procedure yesterday. He is still on relatively low doses of pressors but was extubated, is alert and appears relatively comfortable and has excellent hemodynamic parameters.  Receiving 2 units of PRBC. Little bleeding from chest tube. Roughly 16 pounds above preoperative weight.  Inpatient Medications    Scheduled Meds: . acetaminophen  1,000 mg Oral Q6H   Or  . acetaminophen (TYLENOL) oral liquid 160 mg/5 mL  1,000 mg Per Tube Q6H  . aspirin EC  325 mg Oral Daily   Or  . aspirin  324 mg Per Tube Daily  . atorvastatin  80 mg Oral q1800  . bisacodyl  10 mg Oral Daily   Or  . bisacodyl  10 mg Rectal Daily  . docusate sodium  200 mg Oral Daily  . furosemide  40 mg Intravenous BID  . insulin aspart  0-24 Units Subcutaneous Q4H  . insulin detemir  12 Units Subcutaneous BID  . ketorolac  15 mg Intravenous Q6H  . mouth rinse  15 mL Mouth Rinse BID  . metoCLOPramide (REGLAN) injection  10 mg Intravenous Q6H  . metoprolol tartrate  12.5 mg Oral BID   Or  . metoprolol tartrate  12.5 mg Per Tube BID  . mupirocin ointment  1 application Nasal BID  . [START ON 02/17/2017] pantoprazole  40 mg Oral Daily  . sodium chloride flush  3 mL Intravenous Q12H   Continuous Infusions: . sodium chloride 10 mL/hr at 02/16/17 0800  . sodium chloride 250 mL (02/16/17 0800)  . sodium chloride 20 mL/hr at 02/16/17 0800  . cefUROXime (ZINACEF)  IV Stopped (02/16/17 0416)  . insulin (NOVOLIN-R) infusion 3.5 Units/hr (02/16/17 0755)  . lactated ringers    . lactated ringers 20 mL/hr at 02/16/17 0800  . lactated ringers 20 mL/hr at 02/16/17  0800  . milrinone 0.252 mcg/kg/min (02/16/17 0800)  . nitroGLYCERIN Stopped (02/16/17 0026)  . phenylephrine (NEO-SYNEPHRINE) Adult infusion Stopped (02/15/17 1600)  . vancomycin Stopped (02/16/17 0319)   PRN Meds: sodium chloride, lactated ringers, metoprolol tartrate, midazolam, morphine injection, ondansetron (ZOFRAN) IV, oxyCODONE, sodium chloride flush, traMADol   Vital Signs    Vitals:   02/16/17 0745 02/16/17 0800 02/16/17 0814 02/16/17 0830  BP:  126/75 129/68 127/65  Pulse: (!) 102 100 (!) 101 99  Resp: (!) 26 (!) 24 (!) 27 (!) 21  Temp: 98.2 F (36.8 C) 98.2 F (36.8 C) 98.2 F (36.8 C) 98.2 F (36.8 C)  TempSrc:  Core (Comment) Core (Comment) Core (Comment)  SpO2: 100% 99% 99% 98%  Weight:      Height:        Intake/Output Summary (Last 24 hours) at 02/16/17 0849 Last data filed at 02/16/17 0830  Gross per 24 hour  Intake          8769.52 ml  Output             4870 ml  Net          3899.52 ml   Filed Weights   02/14/17 0500 02/15/17 0500 02/16/17 0500  Weight: 216 lb 8 oz (98.2 kg) 213 lb 3.2 oz (96.7 kg) 232 lb 12.8 oz (105.6 kg)  Telemetry    Sinus rhythm - Personally Reviewed  ECG    Sinus tachycardia, inferior Q waves, voltage for LVH, T-wave inversion in leads V4-V6, I, aVL - Personally Reviewed  Physical Exam  Awake alert, conversant GEN: No acute distress.   Neck:  difficult to see JVD Cardiac: RRR, no murmurs or gallops. Soft pericardial rub is present Respiratory: Clear to auscultation bilaterally anteriorly. GI: Soft, nontender, non-distended bowel sounds are present MS: No edema; No deformity. Neuro:  Nonfocal  Psych: Normal affect   Labs    Chemistry Recent Labs Lab 02/10/17 1252 02/10/17 1545  02/12/17 0301 02/15/17 0227  02/15/17 1331 02/15/17 1449 02/15/17 2043 02/15/17 2050 02/16/17 0342  NA 142 141  < > 136 136  < > 140 141  --  138 136  K 4.8 2.8*  < > 4.5 3.9  < > 4.7 4.5  --  4.4 4.1  CL 102 110  < > 102 102   < > 103  --   --  104 105  CO2 23 22  < > 25 23  --   --   --   --   --  23  GLUCOSE 90 90  < > 94 98  < > 141* 116*  --  124* 143*  BUN 12 10  < > 12 13  < > 13  --   --  11 10  CREATININE 1.00 0.98  < > 1.04 1.06  < > 0.80  --  0.99 0.90 1.02  CALCIUM 9.7 7.3*  < > 8.8* 9.0  --   --   --   --   --  7.7*  PROT 7.8 5.7*  --  7.3  --   --   --   --   --   --   --   ALBUMIN 5.0 3.4*  --  4.1  --   --   --   --   --   --   --   AST 58* 40  --  31  --   --   --   --   --   --   --   ALT 50* 38  --  42  --   --   --   --   --   --   --   ALKPHOS 54 42  --  47  --   --   --   --   --   --   --   BILITOT 0.7 0.6  --  0.9  --   --   --   --   --   --   --   GFRNONAA 96 >60  < > >60 >60  --   --   --  >60  --  >60  GFRAA 111 >60  < > >60 >60  --   --   --  >60  --  >60  ANIONGAP  --  9  < > 9 11  --   --   --   --   --  8  < > = values in this interval not displayed.   Hematology Recent Labs Lab 02/15/17 1447  02/15/17 2043 02/15/17 2050 02/16/17 0342  WBC 9.9  --  8.3  --  7.4  RBC 3.25*  --  2.98*  --  2.55*  HGB 8.1*  < > 7.4* 7.1* 6.4*  HCT 25.2*  < > 23.1* 21.0* 19.5*  MCV 77.5*  --  77.5*  --  76.5*  MCH 24.9*  --  24.8*  --  25.1*  MCHC 32.1  --  32.0  --  32.8  RDW 16.1*  --  16.0*  --  16.3*  PLT 123*  --  125*  --  120*  < > = values in this interval not displayed.  Cardiac Enzymes Recent Labs Lab 02/10/17 1130 02/10/17 1545  TROPONINI 10.67* 1.77*   No results for input(s): TROPIPOC in the last 168 hours.   BNPNo results for input(s): BNP, PROBNP in the last 168 hours.   DDimer No results for input(s): DDIMER in the last 168 hours.   Radiology    Dg Chest Port 1 View  Result Date: 02/16/2017 CLINICAL DATA:  CABG. EXAM: PORTABLE CHEST 1 VIEW COMPARISON:  02/15/2017 FINDINGS: Endotracheal tube and nasogastric have been removed. Swan-Ganz catheter in the proximal right pulmonary artery region. There is a mediastinal tube and a left chest tube. Low lung volumes  without focal lung disease. Negative for a pneumothorax. Stable appearance of the heart and mediastinum with post CABG changes. IMPRESSION: Low lung volumes without focal lung disease. Negative for pneumothorax. Support apparatuses as described. Electronically Signed   By: Richarda OverlieAdam  Henn M.D.   On: 02/16/2017 07:57   Dg Chest Port 1 View  Result Date: 02/15/2017 CLINICAL DATA:  Status post CABG. EXAM: PORTABLE CHEST 1 VIEW COMPARISON:  02/10/2017 FINDINGS: 1455 hours. Endotracheal tube tip is 3.5 cm above the base of the carina. Right IJ central line tip projects in the main pulmonary artery. NG tube tip overlies the gastric fundus. Midline mediastinal/ pericardial drain noted. Left chest tube visualized in situ. No evidence for pneumothorax. Lung volumes are low with enlarged cardiopericardial silhouette. There is pulmonary vascular congestion without overt pulmonary edema. Bibasilar atelectasis evident without substantial pleural effusion. Telemetry leads overlie the chest. IMPRESSION: Low volume film with basilar atelectasis and vascular congestion. Support apparatus as above. Electronically Signed   By: Kennith CenterEric  Mansell M.D.   On: 02/15/2017 15:21    Cardiac Studies   02/10/2017 CATH Diagnostic Diagram       02/12/2017 ECHO - Left ventricle: The cavity size was normal. There was moderate concentric hypertrophy. Systolic function was moderately to severely reduced. The estimated ejection fraction was in the range of 30% to 35%. Diffuse hypokinesis. Doppler parameters are consistent with abnormal left ventricular relaxation (grade 1 diastolic dysfunction). Doppler parameters are consistent with indeterminate ventricular filling pressure. - Aortic valve: Transvalvular velocity was within the normal range. There was no stenosis. There was no regurgitation. - Mitral valve: Transvalvular velocity was within the normal range. There was no evidence for stenosis. There was mild  regurgitation. - Left atrium: The atrium was mildly dilated. - Right ventricle: The cavity size was normal. Wall thickness was normal. Systolic function was normal. - Atrial septum: No defect or patent foramen ovale was identified. - Tricuspid valve: There was trivial regurgitation. - Pulmonary arteries: Systolic pressure was within the normal range. PA peak pressure: 22 mm Hg (S).   Patient Profile     36 y.o. male with premature onset CAD with multivessel involvement, resulting with a non-STEMI and moderate LV dysfunction (EF 30-35%), now day 1 after CABG  Assessment & Plan    1. Postop cardiogenic shock: Rapidly recovering, weaning off pressors. 2. CHF: EF was 30% preop and showed marked improvement by TEE towards ends of surgery. He is 16 pounds overweight preop weight and this is  likely to increase with blood transfusion. Also receiving diuretics. 3. HTN: As he recovers from surgery, prefer carvedilol and RAAS inhibitors for blood pressure control. 4. HLP: high-dose atorvastatin.  For questions or updates, please contact [facility] Please consult [email or web address] for contact info under Cardiology/STEMI.      Signed, Thurmon Fair, MD  02/16/2017, 8:49 AM

## 2017-02-16 NOTE — Procedures (Signed)
Extubation Procedure Note  Patient Details:   Name: [name] [name] DOB: 15-Mar-1981 MRN: [id number]   Airway Documentation:     Evaluation  O2 sats: stable throughout Complications: No apparent complications Patient did tolerate procedure well. Bilateral Breath Sounds: Clear, Diminished   Yes   Patient extubated to [facility] without any complications. Patient able to speak clearly and has an adequate cough.   Sharene Skeans 02/16/2017, 1:05 AM

## 2017-02-16 NOTE — Care Management Note (Addendum)
Case Management Note  Patient Details  Name: [name] [name] MRN: [id number] Date of Birth: 1980/07/14  Subjective/Objective:    From home with family, wife will be with him at dc, presents with chest pain, s/p heart cath shows severe 3 vessel dz, for surgery consult for CABG, for possible CABG on Tues 10/23. No PCP , no insurance on file.  He may need medication ast at discharge.     10/24 1758 Letha Cape RN, BSN - POD 1 CABG, acute cardiogenic shock after sternal incision, conts on chest tube, NG tube, extubated to 4 liters today, conts on milrinone, nitro, morphine iv prn, iv abx, lasix iv, lopressor iv prn.   10/26 1012 Letha Cape RN,BSN- POD 3 CABG, NCM used video interpreter , informed patient that he has hospital follow up apt at [facility] clinic on 11/2 at 8 am, and if he is dc Mon- Fri , he will need to go there for medication ast if needed , he states he is not [place] citizen, not eligible for Match.  Hopefully will be able to dc Mon - Friday for assist at [facility] clinic.                              Action/Plan: NCM will cont to follow for dc needs.   Expected Discharge Date:                  Expected Discharge Plan:     In-House Referral:     Discharge planning Services  CM Consult  Post Acute Care Choice:    Choice offered to:     DME Arranged:    DME Agency:     HH Arranged:    HH Agency:     Status of Service:  In process, will continue to follow  If discussed at Long Length of Stay Meetings, dates discussed:    Additional Comments:  Leone Haven, RN 02/16/2017, 5:58 PM

## 2017-02-16 NOTE — Progress Notes (Signed)
NIF-20, VC 1.2L 

## 2017-02-16 NOTE — Progress Notes (Signed)
Patient ID: [name] [name], male   DOB: 12/07/1980, 36 y.o.   MRN: [id number] TCTS Evening Rounds:  Hemodynamically stable on 0.3 milrinone  sats 98%  Diuresing some  CBC    Component Value Date/Time   WBC 7.9 02/16/2017 1459   RBC 3.51 (L) 02/16/2017 1459   HGB 9.2 (L) 02/16/2017 1459   HGB 13.5 02/10/2017 1252   HCT 27.8 (L) 02/16/2017 1459   HCT 41.5 02/10/2017 1252   PLT 138 (L) 02/16/2017 1459   PLT 226 02/10/2017 1252   MCV 79.2 02/16/2017 1459   MCV 77 (L) 02/10/2017 1252   MCH 26.2 02/16/2017 1459   MCHC 33.1 02/16/2017 1459   RDW 16.0 (H) 02/16/2017 1459   RDW 17.7 (H) 02/10/2017 1252   LYMPHSABS 1.3 02/10/2017 1545   LYMPHSABS 1.4 02/10/2017 1252   MONOABS 0.5 02/10/2017 1545   EOSABS 0.1 02/10/2017 1545   EOSABS 0.1 02/10/2017 1252   BASOSABS 0.0 02/10/2017 1545   BASOSABS 0.1 02/10/2017 1252

## 2017-02-17 ENCOUNTER — Inpatient Hospital Stay (HOSPITAL_COMMUNITY): Payer: Self-pay

## 2017-02-17 DIAGNOSIS — T8111XA Postprocedural  cardiogenic shock, initial encounter: Secondary | ICD-10-CM

## 2017-02-17 DIAGNOSIS — I5041 Acute combined systolic (congestive) and diastolic (congestive) heart failure: Secondary | ICD-10-CM

## 2017-02-17 LAB — GLUCOSE, CAPILLARY
GLUCOSE-CAPILLARY: 122 mg/dL — AB (ref 65–99)
GLUCOSE-CAPILLARY: 120 mg/dL — AB (ref 65–99)
Glucose-Capillary: 156 mg/dL — ABNORMAL HIGH (ref 65–99)
Glucose-Capillary: 118 mg/dL — ABNORMAL HIGH (ref 65–99)
Glucose-Capillary: 97 mg/dL (ref 65–99)

## 2017-02-17 LAB — CBC
HCT: 27.6 % — ABNORMAL LOW (ref 39.0–52.0)
Hemoglobin: 9 g/dL — ABNORMAL LOW (ref 13.0–17.0)
MCH: 25.9 pg — ABNORMAL LOW (ref 26.0–34.0)
MCHC: 32.6 g/dL (ref 30.0–36.0)
MCV: 79.3 fL (ref 78.0–100.0)
Platelets: 140 10*3/uL — ABNORMAL LOW (ref 150–400)
RBC: 3.48 MIL/uL — ABNORMAL LOW (ref 4.22–5.81)
RDW: 16.3 % — ABNORMAL HIGH (ref 11.5–15.5)
WBC: 8.8 10*3/uL (ref 4.0–10.5)

## 2017-02-17 LAB — TYPE AND SCREEN
ABO/RH(D): O POS
Antibody Screen: NEGATIVE
Unit division: 0
Unit division: 0

## 2017-02-17 LAB — BASIC METABOLIC PANEL
Anion gap: 8 (ref 5–15)
BUN: 8 mg/dL (ref 6–20)
CO2: 30 mmol/L (ref 22–32)
Calcium: 8.2 mg/dL — ABNORMAL LOW (ref 8.9–10.3)
Chloride: 98 mmol/L — ABNORMAL LOW (ref 101–111)
Creatinine, Ser: 0.92 mg/dL (ref 0.61–1.24)
GFR calc Af Amer: >60 mL/min (ref >60)
GFR calc non Af Amer: >60 mL/min (ref >60)
Glucose, Bld: 121 mg/dL — ABNORMAL HIGH (ref 65–99)
Potassium: 3.8 mmol/L (ref 3.5–5.1)
Sodium: 136 mmol/L (ref 135–145)

## 2017-02-17 LAB — POCT I-STAT, CHEM 8
BUN: 11 mg/dL (ref 6–20)
CALCIUM ION: 1.07 mmol/L — AB (ref 1.15–1.40)
CHLORIDE: 87 mmol/L — AB (ref 101–111)
Creatinine, Ser: 1 mg/dL (ref 0.61–1.24)
GLUCOSE: 119 mg/dL — AB (ref 65–99)
HCT: 31 % — ABNORMAL LOW (ref 39.0–52.0)
Hemoglobin: 10.5 g/dL — ABNORMAL LOW (ref 13.0–17.0)
Potassium: 3.4 mmol/L — ABNORMAL LOW (ref 3.5–5.1)
SODIUM: 133 mmol/L — AB (ref 135–145)
TCO2: 30 mmol/L (ref 22–32)

## 2017-02-17 LAB — BPAM RBC
Blood Product Expiration Date: 201811152359
Blood Product Expiration Date: 201811152359
ISSUE DATE / TIME: 201810240758
ISSUE DATE / TIME: 201810240948
Unit Type and Rh: 5100
Unit Type and Rh: 5100

## 2017-02-17 LAB — COOXEMETRY PANEL
Carboxyhemoglobin: 0.9 % (ref 0.5–1.5)
Methemoglobin: 1.3 % (ref 0.0–1.5)
O2 Saturation: 70.2 %
Total hemoglobin: 9.1 g/dL — ABNORMAL LOW (ref 12.0–16.0)

## 2017-02-17 MED ORDER — AMIODARONE LOAD VIA INFUSION
150.0000 mg | Freq: Once | INTRAVENOUS | Status: DC
Start: 2017-02-17 — End: 2017-02-17
  Filled 2017-02-17 (×2): qty 83.34

## 2017-02-17 MED ORDER — AMIODARONE HCL 200 MG PO TABS
400.0000 mg | ORAL_TABLET | Freq: Two times a day (BID) | ORAL | Status: DC
  Administered 2017-02-17 – 2017-02-21 (×9): 400 mg via ORAL
  Filled 2017-02-17 (×9): qty 2

## 2017-02-17 MED ORDER — INSULIN ASPART 100 UNIT/ML ~~LOC~~ SOLN
4.0000 [IU] | Freq: Three times a day (TID) | SUBCUTANEOUS | Status: DC
  Administered 2017-02-17 – 2017-02-19 (×9): 4 [IU] via SUBCUTANEOUS

## 2017-02-17 MED ORDER — INSULIN ASPART 100 UNIT/ML ~~LOC~~ SOLN
0.0000 [IU] | Freq: Three times a day (TID) | SUBCUTANEOUS | Status: DC
  Administered 2017-02-17: 2 [IU] via SUBCUTANEOUS
  Administered 2017-02-17: 3 [IU] via SUBCUTANEOUS

## 2017-02-17 MED ORDER — INSULIN ASPART 100 UNIT/ML ~~LOC~~ SOLN: 0.0000 [IU] | Freq: Every day | SUBCUTANEOUS | Status: DC

## 2017-02-17 MED ORDER — POTASSIUM CHLORIDE 10 MEQ/50ML IV SOLN
10.0000 meq | INTRAVENOUS | Status: AC | PRN
  Administered 2017-02-17 (×3): 10 meq via INTRAVENOUS
  Filled 2017-02-17 (×3): qty 50

## 2017-02-17 MED ORDER — AMIODARONE IV BOLUS ONLY 150 MG/100ML
150.0000 mg | Freq: Once | INTRAVENOUS | Status: AC
  Administered 2017-02-17: 150 mg via INTRAVENOUS

## 2017-02-17 NOTE — Progress Notes (Signed)
2 Days Post-Op Procedure(s) (LRB): CORONARY ARTERY BYPASS GRAFTING (CABG), ON PUMP, TIMES FOUR, USING LEFT INTERNAL MAMMARY ARTERY AND ENDOSCOPICALLY HARVESTED RIGHT GREATER SAPHENOUS VEIN (N/A) TRANSESOPHAGEAL ECHOCARDIOGRAM (TEE) (N/A) Subjective: cabg after nonstemi. LV dysfunction Postop anemia- improved Sinus tach Weaning milrinone  Objective: Vital signs in last 24 hours: Temp:  [97.9 F (36.6 C)-99.1 F (37.3 C)] 98.4 F (36.9 C) (10/25 0727) Pulse Rate:  [98-120] 110 (10/25 0700) Cardiac Rhythm: Sinus tachycardia (10/25 0000) Resp:  [21-38] 38 (10/25 0700) BP: (106-192)/(65-103) 114/72 (10/25 0700) SpO2:  [86 %-100 %] 90 % (10/25 0700) Arterial Line BP: (127-194)/(64-82) 154/82 (10/24 1001) Weight:  [223 lb 1.7 oz (101.2 kg)] 223 lb 1.7 oz (101.2 kg) (10/25 0530)  Hemodynamic parameters for last 24 hours: PAP: (23-34)/(12-17) 34/17 CO:  [8.3 L/min] 8.3 L/min CI:  [3.9 L/min/m2] 3.9 L/min/m2  Intake/Output from previous day: 10/24 0701 - 10/25 0700 In: 2533 [P.O.:720; I.V.:773.8; Blood:939.2; IV Piggyback:100] Out: 4850 [Urine:4280; Chest Tube:570] Intake/Output this shift: No intake/output data recorded.       Exam    General- alert and comfortable   Lungs- clear without rales, wheezes   Cor- regular rate and rhythm, no murmur , gallop   Abdomen- soft, non-tender   Extremities - warm, non-tender, minimal edema   Neuro- oriented, appropriate, no focal weakness   Lab Results:  Recent Labs  02/16/17 1459 02/17/17 0418  WBC 7.9 8.8  HGB 9.2* 9.0*  HCT 27.8* 27.6*  PLT 138* 140*   BMET:  Recent Labs  02/16/17 0342 02/16/17 1459 02/17/17 0418  NA 136  --  136  K 4.1  --  3.8  CL 105  --  98*  CO2 23  --  30  GLUCOSE 143*  --  121*  BUN 10  --  8  CREATININE 1.02 1.11 0.92  CALCIUM 7.7*  --  8.2*    PT/INR:  Recent Labs  02/15/17 1447  LABPROT 17.7*  INR 1.47   ABG    Component Value Date/Time   PHART 7.468 (H) 02/16/2017 0200   HCO3  27.3 02/16/2017 0200   TCO2 28 02/16/2017 0200   ACIDBASEDEF 1.0 02/16/2017 0057   O2SAT 70.2 02/17/2017 0425   CBG (last 3)   Recent Labs  02/16/17 2328 02/17/17 0358 02/17/17 0725  GLUCAP 130* 122* 120*    Assessment/Plan: S/P Procedure(s) (LRB): CORONARY ARTERY BYPASS GRAFTING (CABG), ON PUMP, TIMES FOUR, USING LEFT INTERNAL MAMMARY ARTERY AND ENDOSCOPICALLY HARVESTED RIGHT GREATER SAPHENOUS VEIN (N/A) TRANSESOPHAGEAL ECHOCARDIOGRAM (TEE) (N/A) Mobilize Diuresis Diabetes control d/c tubes/lines start po amiodarone   LOS: 7 days    [name] [name] 02/17/2017

## 2017-02-17 NOTE — Progress Notes (Signed)
Progress Note  Patient Name: [name] [name] Date of Encounter: 02/17/2017  Primary Cardiologist: Anne Fu  Subjective   Day 2 after CABG 4 (LIMA to LAD, SVG to ramus intermedius, SVG to diagonal, SVG to RCA). Had cardiogenic shock immediately following sternal incision, but left ventricular systolic function showed marked improvement by the end of his procedure yesterday.  Almost off all pressors. Excellent mixed venous O2 at 70%. Hgb 9.0 after 2 units of PRBC. No serious bleeding. Good UO. 2.7L ahead by in/out, commensurate 6-7 lbs above preoperative weight.  Inpatient Medications    Scheduled Meds: . acetaminophen  1,000 mg Oral Q6H   Or  . acetaminophen (TYLENOL) oral liquid 160 mg/5 mL  1,000 mg Per Tube Q6H  . amiodarone  400 mg Oral BID  . aspirin EC  325 mg Oral Daily   Or  . aspirin  324 mg Per Tube Daily  . atorvastatin  80 mg Oral q1800  . bisacodyl  10 mg Oral Daily   Or  . bisacodyl  10 mg Rectal Daily  . docusate sodium  200 mg Oral Daily  . furosemide  40 mg Intravenous BID  . insulin aspart  0-15 Units Subcutaneous TID WC  . insulin aspart  0-5 Units Subcutaneous QHS  . insulin aspart  4 Units Subcutaneous TID WC  . insulin detemir  12 Units Subcutaneous BID  . lisinopril  10 mg Oral Daily  . mouth rinse  15 mL Mouth Rinse BID  . metoprolol tartrate  25 mg Oral BID  . pantoprazole  40 mg Oral Daily  . sodium chloride flush  3 mL Intravenous Q12H   Continuous Infusions: . sodium chloride Stopped (02/16/17 1000)  . sodium chloride 250 mL (02/16/17 0800)  . sodium chloride Stopped (02/16/17 1008)  . lactated ringers Stopped (02/16/17 1009)  . milrinone 0.125 mcg/kg/min (02/17/17 0752)   PRN Meds: sodium chloride, metoprolol tartrate, midazolam, morphine injection, ondansetron (ZOFRAN) IV, oxyCODONE, sodium chloride flush, traMADol   Vital Signs    Vitals:   02/17/17 0830 02/17/17 0845 02/17/17 0900 02/17/17 1000  BP: 111/79 116/80 120/76  120/72  Pulse: (!) 110 (!) 109 (!) 108 (!) 106  Resp: (!) 27 (!) 35 (!) 31 (!) 36  Temp:      TempSrc:      SpO2: 91% 92% 93% 94%  Weight:      Height:        Intake/Output Summary (Last 24 hours) at 02/17/17 1051 Last data filed at 02/17/17 1000  Gross per 24 hour  Intake          1937.55 ml  Output             4375 ml  Net         -2437.45 ml   Filed Weights   02/15/17 0500 02/16/17 0500 02/17/17 0530  Weight: 213 lb 3.2 oz (96.7 kg) 232 lb 12.8 oz (105.6 kg) 223 lb 1.7 oz (101.2 kg)    Telemetry    Sinus tachycardia, no atrial fibrillation or VT - Personally Reviewed  ECG    No new tracing - Personally Reviewed  Physical Exam  Alert, mild discomfort GEN: No acute distress.   Neck: No JVD Cardiac: RRR, no murmurs, or gallops. Squeaky pericardial rub. Respiratory: Clear to auscultation bilaterally. GI: Soft, nontender, non-distended  MS: Minimal edema R calf; No deformity. Neuro:  Nonfocal  Psych: Normal affect   Labs    Chemistry Recent Labs Lab 02/10/17 1252 02/10/17  1545  02/12/17 0301 02/15/17 0227  02/15/17 2050 02/16/17 0342 02/16/17 1459 02/17/17 0418  NA 142 141  < > 136 136  < > 138 136  --  136  K 4.8 2.8*  < > 4.5 3.9  < > 4.4 4.1  --  3.8  CL 102 110  < > 102 102  < > 104 105  --  98*  CO2 23 22  < > 25 23  --   --  23  --  30  GLUCOSE 90 90  < > 94 98  < > 124* 143*  --  121*  BUN 12 10  < > 12 13  < > 11 10  --  8  CREATININE 1.00 0.98  < > 1.04 1.06  < > 0.90 1.02 1.11 0.92  CALCIUM 9.7 7.3*  < > 8.8* 9.0  --   --  7.7*  --  8.2*  PROT 7.8 5.7*  --  7.3  --   --   --   --   --   --   ALBUMIN 5.0 3.4*  --  4.1  --   --   --   --   --   --   AST 58* 40  --  31  --   --   --   --   --   --   ALT 50* 38  --  42  --   --   --   --   --   --   ALKPHOS 54 42  --  47  --   --   --   --   --   --   BILITOT 0.7 0.6  --  0.9  --   --   --   --   --   --   GFRNONAA 96 >60  < > >60 >60  < >  --  >60 >60 >60  GFRAA 111 >60  < > >60 >60  < >  --   >60 >60 >60  ANIONGAP  --  9  < > 9 11  --   --  8  --  8  < > = values in this interval not displayed.   Hematology Recent Labs Lab 02/16/17 0342 02/16/17 1459 02/17/17 0418  WBC 7.4 7.9 8.8  RBC 2.55* 3.51* 3.48*  HGB 6.4* 9.2* 9.0*  HCT 19.5* 27.8* 27.6*  MCV 76.5* 79.2 79.3  MCH 25.1* 26.2 25.9*  MCHC 32.8 33.1 32.6  RDW 16.3* 16.0* 16.3*  PLT 120* 138* 140*    Cardiac Enzymes Recent Labs Lab 02/10/17 1130 02/10/17 1545  TROPONINI 10.67* 1.77*   No results for input(s): TROPIPOC in the last 168 hours.   BNPNo results for input(s): BNP, PROBNP in the last 168 hours.   DDimer No results for input(s): DDIMER in the last 168 hours.   Radiology    Dg Chest Port 1 View  Result Date: 02/17/2017 CLINICAL DATA:  History of CABG EXAM: PORTABLE CHEST 1 VIEW COMPARISON:  Yesterday FINDINGS: Swan-Ganz catheter has been removed. Thoracic drains and right IJ sheath persists. Low volume chest with atelectasis. Stable cardiopericardial enlargement. No edema, effusion, or pneumothorax. IMPRESSION: Stable low volume chest with atelectasis. Electronically Signed   By: Marnee Spring M.D.   On: 02/17/2017 07:48   Dg Chest Port 1 View  Result Date: 02/16/2017 CLINICAL DATA:  CABG. EXAM: PORTABLE CHEST 1 VIEW COMPARISON:  02/15/2017 FINDINGS: Endotracheal tube and  nasogastric have been removed. Swan-Ganz catheter in the proximal right pulmonary artery region. There is a mediastinal tube and a left chest tube. Low lung volumes without focal lung disease. Negative for a pneumothorax. Stable appearance of the heart and mediastinum with post CABG changes. IMPRESSION: Low lung volumes without focal lung disease. Negative for pneumothorax. Support apparatuses as described. Electronically Signed   By: Richarda OverlieAdam  Henn M.D.   On: 02/16/2017 07:57   Dg Chest Port 1 View  Result Date: 02/15/2017 CLINICAL DATA:  Status post CABG. EXAM: PORTABLE CHEST 1 VIEW COMPARISON:  02/10/2017 FINDINGS: 1455 hours.  Endotracheal tube tip is 3.5 cm above the base of the carina. Right IJ central line tip projects in the main pulmonary artery. NG tube tip overlies the gastric fundus. Midline mediastinal/ pericardial drain noted. Left chest tube visualized in situ. No evidence for pneumothorax. Lung volumes are low with enlarged cardiopericardial silhouette. There is pulmonary vascular congestion without overt pulmonary edema. Bibasilar atelectasis evident without substantial pleural effusion. Telemetry leads overlie the chest. IMPRESSION: Low volume film with basilar atelectasis and vascular congestion. Support apparatus as above. Electronically Signed   By: Kennith CenterEric  Mansell M.D.   On: 02/15/2017 15:21    Cardiac Studies   02/10/2017 CATH Diagnostic Diagram       02/12/2017 ECHO - Left ventricle: The cavity size was normal. There was moderate concentric hypertrophy. Systolic function was moderately to severely reduced. The estimated ejection fraction was in the range of 30% to 35%. Diffuse hypokinesis. Doppler parameters are consistent with abnormal left ventricular relaxation (grade 1 diastolic dysfunction). Doppler parameters are consistent with indeterminate ventricular filling pressure. - Aortic valve: Transvalvular velocity was within the normal range. There was no stenosis. There was no regurgitation. - Mitral valve: Transvalvular velocity was within the normal range. There was no evidence for stenosis. There was mild regurgitation. - Left atrium: The atrium was mildly dilated. - Right ventricle: The cavity size was normal. Wall thickness was normal. Systolic function was normal. - Atrial septum: No defect or patent foramen ovale was identified. - Tricuspid valve: There was trivial regurgitation. - Pulmonary arteries: Systolic pressure was within the normal range. PA peak pressure: 22 mm Hg (S).     Patient Profile     36 y.o. male with premature onset CAD with  multivessel involvement, resulting with a non-STEMI and moderate LV dysfunction (EF 30-35%), now day 2 after CABG, almost weaned of pressors  Assessment & Plan    1. Postop cardiogenic shock: Rapidly recovering, almost completely off pressors, good CO by co-ox. 2. CHF: EF was 30% preop and showed marked improvement by TEE towards ends of surgery. He is 6-7 pounds over preop weight, excellent diuresis so far. 3. HTN: As he recovers from surgery, prefer carvedilol and RAAS inhibitors for blood pressure control. Currently on a tiny dose of metoprolol and moderate dose lisinopril. BP is great. 4. HLP: high-dose atorvastatin.  For questions or updates, please contact [facility] Please consult [email or web address] for contact info under Cardiology/STEMI.      Signed, Thurmon FairMihai Daisy Mcneel, MD  02/17/2017, 10:51 AM

## 2017-02-17 NOTE — Progress Notes (Signed)
      [address]       [place] [address]             [phone]      POD # 2 CABG x 4  BP 123/76   Pulse (!) 111   Temp 98.9 F (37.2 C) (Oral)   Resp (!) 32   Ht 5\' 9"  (1.753 m)   Wt 223 lb 1.7 oz (101.2 kg)   SpO2 96%   BMI 32.95 kg/m    Intake/Output Summary (Last 24 hours) at 02/17/17 1744 Last data filed at 02/17/17 1700  Gross per 24 hour  Intake          1247.16 ml  Output             4700 ml  Net         -3452.84 ml   Continue present care  Viviann Spare C. Dorris Fetch, MD [facility] [phone]'

## 2017-02-18 ENCOUNTER — Inpatient Hospital Stay (HOSPITAL_COMMUNITY): Payer: Self-pay

## 2017-02-18 DIAGNOSIS — I25110 Atherosclerotic heart disease of native coronary artery with unstable angina pectoris: Secondary | ICD-10-CM

## 2017-02-18 LAB — CBC
HCT: 29 % — ABNORMAL LOW (ref 39.0–52.0)
Hemoglobin: 9.5 g/dL — ABNORMAL LOW (ref 13.0–17.0)
MCH: 25.9 pg — ABNORMAL LOW (ref 26.0–34.0)
MCHC: 32.8 g/dL (ref 30.0–36.0)
MCV: 79 fL (ref 78.0–100.0)
Platelets: 177 10*3/uL (ref 150–400)
RBC: 3.67 MIL/uL — ABNORMAL LOW (ref 4.22–5.81)
RDW: 16.3 % — ABNORMAL HIGH (ref 11.5–15.5)
WBC: 10.4 10*3/uL (ref 4.0–10.5)

## 2017-02-18 LAB — COOXEMETRY PANEL
Carboxyhemoglobin: 0.8 % (ref 0.5–1.5)
Methemoglobin: 1.2 % (ref 0.0–1.5)
O2 Saturation: 71.4 %
Total hemoglobin: 9.7 g/dL — ABNORMAL LOW (ref 12.0–16.0)

## 2017-02-18 LAB — COMPREHENSIVE METABOLIC PANEL
ALT: 35 U/L (ref 17–63)
AST: 35 U/L (ref 15–41)
Albumin: 3.6 g/dL (ref 3.5–5.0)
Alkaline Phosphatase: 52 U/L (ref 38–126)
Anion gap: 10 (ref 5–15)
BUN: 12 mg/dL (ref 6–20)
CO2: 33 mmol/L — ABNORMAL HIGH (ref 22–32)
Calcium: 8.7 mg/dL — ABNORMAL LOW (ref 8.9–10.3)
Chloride: 90 mmol/L — ABNORMAL LOW (ref 101–111)
Creatinine, Ser: 0.94 mg/dL (ref 0.61–1.24)
GFR calc Af Amer: >60 mL/min (ref >60)
GFR calc non Af Amer: >60 mL/min (ref >60)
Glucose, Bld: 108 mg/dL — ABNORMAL HIGH (ref 65–99)
Potassium: 3.3 mmol/L — ABNORMAL LOW (ref 3.5–5.1)
Sodium: 133 mmol/L — ABNORMAL LOW (ref 135–145)
Total Bilirubin: 1.2 mg/dL (ref 0.3–1.2)
Total Protein: 6.3 g/dL — ABNORMAL LOW (ref 6.5–8.1)

## 2017-02-18 LAB — GLUCOSE, CAPILLARY
GLUCOSE-CAPILLARY: 112 mg/dL — AB (ref 65–99)
GLUCOSE-CAPILLARY: 109 mg/dL — AB (ref 65–99)
GLUCOSE-CAPILLARY: 110 mg/dL — AB (ref 65–99)
Glucose-Capillary: 121 mg/dL — ABNORMAL HIGH (ref 65–99)
Glucose-Capillary: 116 mg/dL — ABNORMAL HIGH (ref 65–99)

## 2017-02-18 MED ORDER — POTASSIUM CHLORIDE CRYS ER 20 MEQ PO TBCR
20.0000 meq | EXTENDED_RELEASE_TABLET | Freq: Every day | ORAL | Status: DC
  Administered 2017-02-18 – 2017-02-20 (×3): 20 meq via ORAL
  Filled 2017-02-18 (×3): qty 1

## 2017-02-18 MED ORDER — FUROSEMIDE 40 MG PO TABS
40.0000 mg | ORAL_TABLET | Freq: Every day | ORAL | Status: DC
  Administered 2017-02-19 – 2017-02-21 (×3): 40 mg via ORAL
  Filled 2017-02-18 (×3): qty 1

## 2017-02-18 MED ORDER — POTASSIUM CHLORIDE 10 MEQ/50ML IV SOLN
10.0000 meq | INTRAVENOUS | Status: AC | PRN
  Administered 2017-02-18 (×3): 10 meq via INTRAVENOUS
  Filled 2017-02-18 (×3): qty 50

## 2017-02-18 MED ORDER — FUROSEMIDE 10 MG/ML IJ SOLN: 40.0000 mg | Freq: Every day | INTRAMUSCULAR | Status: DC

## 2017-02-18 MED ORDER — FUROSEMIDE 10 MG/ML IJ SOLN
40.0000 mg | Freq: Once | INTRAMUSCULAR | Status: AC
  Administered 2017-02-18: 40 mg via INTRAVENOUS
  Filled 2017-02-18: qty 4

## 2017-02-18 NOTE — Progress Notes (Signed)
Patient ID: [name] [name], male   DOB: 07-07-1980, 36 y.o.   MRN: [id number] EVENING ROUNDS NOTE :     [address]       [place] [id number]             [phone]                 3 Days Post-Op Procedure(s) (LRB): CORONARY ARTERY BYPASS GRAFTING (CABG), ON PUMP, TIMES FOUR, USING LEFT INTERNAL MAMMARY ARTERY AND ENDOSCOPICALLY HARVESTED RIGHT GREATER SAPHENOUS VEIN (N/A) TRANSESOPHAGEAL ECHOCARDIOGRAM (TEE) (N/A)  Total Length of Stay:  LOS: 8 days  BP 116/72   Pulse 98   Temp 98 F (36.7 C) (Oral)   Resp (!) 26   Ht 5\' 9"  (1.753 m)   Wt 216 lb 11.2 oz (98.3 kg)   SpO2 98%   BMI 32.00 kg/m   .Intake/Output      10/26 0701 - 10/27 0700   P.O. 1200   I.V. (mL/kg) 6.6 (0.1)   IV Piggyback 50   Total Intake(mL/kg) 1256.6 (12.8)   Urine (mL/kg/hr) 900 (0.7)   Total Output 900   Net +356.6         . sodium chloride Stopped (02/16/17 1000)  . sodium chloride 250 mL (02/16/17 0800)  . sodium chloride Stopped (02/16/17 1008)  . lactated ringers Stopped (02/16/17 1009)     Lab Results  Component Value Date   WBC 10.4 02/18/2017   HGB 9.5 (L) 02/18/2017   HCT 29.0 (L) 02/18/2017   PLT 177 02/18/2017   GLUCOSE 108 (H) 02/18/2017   CHOL 233 (H) 02/10/2017   TRIG 209 (H) 02/10/2017   HDL 38 (L) 02/10/2017   LDLCALC 153 (H) 02/10/2017   ALT 35 02/18/2017   AST 35 02/18/2017   NA 133 (L) 02/18/2017   K 3.3 (L) 02/18/2017   CL 90 (L) 02/18/2017   CREATININE 0.94 02/18/2017   BUN 12 02/18/2017   CO2 33 (H) 02/18/2017   TSH 4.770 (H) 02/10/2017   INR 1.47 02/15/2017   HGBA1C 5.8 (H) 02/12/2017   Stable day   Delight Ovens MD  Beeper [phone] Office [phone] 02/18/2017 7:20 PM

## 2017-02-18 NOTE — Discharge Summary (Signed)
Physician Discharge Summary  Patient ID: [name] [name] MRN: [id number] DOB/AGE: 1980/05/31 36 y.o.  Admit date: 02/10/2017 Discharge date: 02/21/2017  Admission Diagnoses:  Patient Active Problem List   Diagnosis Date Noted  . Essential hypertension, benign 02/10/2017  . LVH (left ventricular hypertrophy) 02/10/2017  . NSTEMI (non-ST elevated myocardial infarction) (HCC) 02/10/2017   Discharge Diagnoses:   Patient Active Problem List   Diagnosis Date Noted  . S/P CABG x 4 02/15/2017  . Essential hypertension, benign 02/10/2017  . LVH (left ventricular hypertrophy) 02/10/2017  . NSTEMI (non-ST elevated myocardial infarction) (HCC) 02/10/2017    Discharged Condition: good  History of Present:  [name] [name] is a 36 yo male with past medical history of hypertension and GERD.  He presented to an urgent care for an annual exam.  Upon presentation he complained of 2 weeks of shortness of breath and sensation of a fast heart rate.  These symptoms became worse with activity, but last for a few minutes and then go away.  He states that he had similar symptoms year ago and did not seek medical therapy.  Workup there consisted of EKG with no acute findings, labs were ordered and it was recommended he undergo workup with Cardiology.  However, patients lab results were abnormal.  He was contacted via his family doctor who called EMS to transport patient to the ED.  Upon arrival patient was chest pain free.  His Troponin level obtained that morning at his office was greater than 10.  Repeat EKG was obtained and showed ST depression in the inferior and lateral leads.  He was started on Heparin and ruled in for NSTEMI.       Hospital Course:   He was admitted via the Cardiology service.  He was taken for cardiac catheterization which showed a mildly reduced EF of 45% and severe multivessel CAD.  It was felt coronary bypass grafting would be indicated and TCTS consult was obtained.   He was evaluated by Dr. Donata Clay who was in agreement the patient would benefit from coronary bypass procedure.  The risks and benefits of the procedure were explained to the patient and he was agreeable to proceed.  The patient was chest pain free during hospitalization.  He was taken to the operating room on 02/15/2017 and underwent CABG x 4 utilizing LIMA to LAD, SVG to Diagonal, SVG to Ramus Intermediate, and SVG to RCA.  He also underwent endoscopic harvest of greater saphenous vein from his right leg.  He tolerated the procedure without difficulty and was taken to the SICU in stable condition.  He was extubated the evening of surgery.  During his stay in the SICU the patient was weaned of Milrinone and Levhophed as tolerated.  He was transfused for expected post operative blood los anemia.  His chest tubes and arterial lines were removed on POD #2.  He was started on Lasix for volume excess.  He was maintaining NSR and transferred to the telemetry unit on 02/19/2017.  The patient continued to make progress.  He was maintaining NSR and his pacing wires were removed without difficulty.  He required additional potassium supplementation for hypokalemia.  He is on Amiodarone however, there was no Atrial Fibrillation noted.  He will require a short course of this and per Cardiology recommendations this can be discontinued in 1 week .  He is ambulating independently.  He is tolerating a diet.  He is medically stable for discharge home today.  Significant Diagnostic Studies: angiography:   Left Main  Vessel is large. Vessel is angiographically normal.  LM lesion, 20% stenosed.  Left Anterior Descending  Prox LAD lesion, 90% stenosed.  Dist LAD lesion, 100% stenosed. The lesion is chronically occluded with left-to-left collateral flow.  First Diagonal Branch  Vessel is small in size.  Second Diagonal Branch  Vessel is small in size.  Third Diagonal Branch  Vessel is moderate in size.  Ramus  Intermedius  Vessel is small.  Ramus lesion, 99% stenosed.  Left Circumflex  Vessel is moderate in size.  Prox Cx lesion, 100% stenosed.  First Obtuse Marginal Branch  Vessel is small in size.  Ost 1st Mrg to 1st Mrg lesion, 90% stenosed.  Right Coronary Artery  Vessel is large.  Mid RCA lesion, 80% stenosed. The lesion is ulcerative.  Dist RCA lesion, 30% stenosed.  Right Posterior Descending Artery  Vessel is small in size.  Right Posterior Atrioventricular Branch  Vessel is moderate in size.   Treatments: surgery:   1. Coronary artery bypass grafting x4 (left internal mammary artery to LAD, saphenous vein graft to diagonal, saphenous vein graft to ramus intermediate, saphenous vein graft to right coronary artery). 2. Endoscopic harvest of right leg greater saphenous vein.  Disposition: 01-Home or Self Care   Discharge Medications:  The patient has been discharged on:   1.Beta Blocker:  Yes [ x  ]                              No   [   ]                              If No, reason:  2.Ace Inhibitor/ARB: Yes [  x ]                                     No  [    ]                                     If No, reason:  3.Statin:   Yes [ x  ]                  No  [   ]                  If No, reason:  4.Ecasa:  Yes  [ x  ]                  No   [   ]                  If No, reason:      Allergies as of 02/21/2017   No Known Allergies     Medication List    STOP taking these medications   amLODipine 5 MG tablet Commonly known as:  NORVASC   labetalol 100 MG tablet Commonly known as:  NORMODYNE   naproxen sodium 220 MG tablet Commonly known as:  ANAPROX     TAKE these medications   amiodarone 400 MG tablet Commonly known as:  PACERONE Take 1 tablet (400 mg total) by mouth daily. For 7 Days   aspirin  325 MG EC tablet Take 1 tablet (325 mg total) by mouth daily.   atorvastatin 80 MG tablet Commonly known as:  LIPITOR Take 1 tablet (80 mg total) by mouth  daily at 6 PM.   clotrimazole 1 % cream Commonly known as:  LOTRIMIN Apply 1 application topically 2 (two) times daily.   furosemide 40 MG tablet Commonly known as:  LASIX Take 1 tablet (40 mg total) by mouth daily.   lisinopril 10 MG tablet Commonly known as:  PRINIVIL,ZESTRIL Take 1 tablet (10 mg total) by mouth daily.   metoprolol tartrate 25 MG tablet Commonly known as:  LOPRESSOR Take 1 tablet (25 mg total) by mouth 2 (two) times daily.   omeprazole 20 MG capsule Commonly known as:  PRILOSEC Take 1 capsule (20 mg total) by mouth 2 (two) times daily before a meal.   oxyCODONE 5 MG immediate release tablet Commonly known as:  Oxy IR/ROXICODONE Take 1-2 tablets (5-10 mg total) by mouth every 4 (four) hours as needed for severe pain.   potassium chloride SA 20 MEQ tablet Commonly known as:  K-DUR,KLOR-CON Take 1 tablet (20 mEq total) by mouth daily.      Follow-up Information    [facility] Follow up on 02/25/2017.   Why:  8 am for hospital follow up Contact information: [address] [place] [id number] [phone]       Kerin PernaVan Trigt, Courtne Lighty, MD Follow up on 03/23/2017.   Specialty:  Cardiothoracic Surgery Why:  Appointment is at 4:30, [facility] located on first floor our office building Contact information: [address] [place] [address] [phone]        Leone BrandIngold, Laura R, NP Follow up on 03/11/2017.   Specialties:  Cardiology, Radiology Why:  11:30 Contact information: [address] [place] [address] [phone]           Signed: Lowella DandyBARRETT, ERIN 02/21/2017, 9:21 AM  patient examined and medical record reviewed,agree with above note. Kathlee Nationseter Van Trigt III 02/21/2017

## 2017-02-18 NOTE — Progress Notes (Signed)
Progress Note  Patient Name: [name] [name] Date of Encounter: 02/18/2017  Primary Cardiologist: Anne FuSkains  Subjective  Day 3 after CABG 4 (LIMA to LAD, SVG to ramus intermedius, SVG to diagonal, SVG to RCA). Had cardiogenic shock immediately following sternal incision, but left ventricular systolic function showed marked improvement by the end of his procedure. Progressing well.  Off pressors completely.  The pain is well controlled.  No arrhythmia. Has walked twice.  Ready for transfer to telemetry bed.  Inpatient Medications    Scheduled Meds: . acetaminophen  1,000 mg Oral Q6H   Or  . acetaminophen (TYLENOL) oral liquid 160 mg/5 mL  1,000 mg Per Tube Q6H  . amiodarone  400 mg Oral BID  . aspirin EC  325 mg Oral Daily   Or  . aspirin  324 mg Per Tube Daily  . atorvastatin  80 mg Oral q1800  . bisacodyl  10 mg Oral Daily   Or  . bisacodyl  10 mg Rectal Daily  . docusate sodium  200 mg Oral Daily  . furosemide  40 mg Intravenous Once  . [START ON 02/19/2017] furosemide  40 mg Oral Daily  . insulin aspart  0-15 Units Subcutaneous TID WC  . insulin aspart  0-5 Units Subcutaneous QHS  . insulin aspart  4 Units Subcutaneous TID WC  . insulin detemir  12 Units Subcutaneous BID  . mouth rinse  15 mL Mouth Rinse BID  . metoprolol tartrate  25 mg Oral BID  . pantoprazole  40 mg Oral Daily  . potassium chloride  20 mEq Oral Daily  . sodium chloride flush  3 mL Intravenous Q12H   Continuous Infusions: . sodium chloride Stopped (02/16/17 1000)  . sodium chloride 250 mL (02/16/17 0800)  . sodium chloride Stopped (02/16/17 1008)  . lactated ringers Stopped (02/16/17 1009)  . potassium chloride 10 mEq (02/18/17 0628)   PRN Meds: sodium chloride, metoprolol tartrate, ondansetron (ZOFRAN) IV, oxyCODONE, potassium chloride, sodium chloride flush, traMADol   Vital Signs    Vitals:   02/18/17 0500 02/18/17 0600 02/18/17 0700 02/18/17 0735  BP: 110/69 108/68 (!) 88/57  108/76  Pulse: 89 (!) 104 (!) 109 (!) 104  Resp: 16 (!) 34 (!) 30 (!) 31  Temp:      TempSrc:      SpO2: 99% 92% 95% 98%  Weight:  216 lb 11.2 oz (98.3 kg)    Height:        Intake/Output Summary (Last 24 hours) at 02/18/17 0904 Last data filed at 02/18/17 0700  Gross per 24 hour  Intake            695.8 ml  Output             5375 ml  Net          -4679.2 ml   Filed Weights   02/16/17 0500 02/17/17 0530 02/18/17 0600  Weight: 232 lb 12.8 oz (105.6 kg) 223 lb 1.7 oz (101.2 kg) 216 lb 11.2 oz (98.3 kg)    Telemetry    Sinus rhythm- Personally Reviewed  ECG    No new tracing- Personally Reviewed  Physical Exam  Comfortable, relaxed. GEN: No acute distress.   Neck: No JVD Cardiac: RRR, no murmurs or gallops.  Pericardial rub has resolved. Respiratory:  Diminished breath sounds in both bases, particularly prominent on the right with some egophony. GI: Soft, nontender, non-distended  MS: No edema; No deformity. Neuro:  Nonfocal  Psych: Normal affect  Labs    Chemistry Recent Labs Lab 02/12/17 0301  02/16/17 0342 02/16/17 1459 02/17/17 0418 02/17/17 1610 02/18/17 0416  NA 136  < > 136  --  136 133* 133*  K 4.5  < > 4.1  --  3.8 3.4* 3.3*  CL 102  < > 105  --  98* 87* 90*  CO2 25  < > 23  --  30  --  33*  GLUCOSE 94  < > 143*  --  121* 119* 108*  BUN 12  < > 10  --  8 11 12   CREATININE 1.04  < > 1.02 1.11 0.92 1.00 0.94  CALCIUM 8.8*  < > 7.7*  --  8.2*  --  8.7*  PROT 7.3  --   --   --   --   --  6.3*  ALBUMIN 4.1  --   --   --   --   --  3.6  AST 31  --   --   --   --   --  35  ALT 42  --   --   --   --   --  35  ALKPHOS 47  --   --   --   --   --  52  BILITOT 0.9  --   --   --   --   --  1.2  GFRNONAA >60  < > >60 >60 >60  --  >60  GFRAA >60  < > >60 >60 >60  --  >60  ANIONGAP 9  < > 8  --  8  --  10  < > = values in this interval not displayed.   Hematology Recent Labs Lab 02/16/17 1459 02/17/17 0418 02/17/17 1610 02/18/17 0416  WBC 7.9 8.8   --  10.4  RBC 3.51* 3.48*  --  3.67*  HGB 9.2* 9.0* 10.5* 9.5*  HCT 27.8* 27.6* 31.0* 29.0*  MCV 79.2 79.3  --  79.0  MCH 26.2 25.9*  --  25.9*  MCHC 33.1 32.6  --  32.8  RDW 16.0* 16.3*  --  16.3*  PLT 138* 140*  --  177    Cardiac EnzymesNo results for input(s): TROPONINI in the last 168 hours. No results for input(s): TROPIPOC in the last 168 hours.   BNPNo results for input(s): BNP, PROBNP in the last 168 hours.   DDimer No results for input(s): DDIMER in the last 168 hours.   Radiology    Dg Chest Port 1 View  Result Date: 02/18/2017 CLINICAL DATA:  Patient status post CABG 02/15/2017. EXAM: PORTABLE CHEST 1 VIEW COMPARISON:  Single-view of the chest 02/17/2017 and 02/16/2017. FINDINGS: Left chest tube has been removed. There is a tiny left apical pneumothorax, less than 5% right IJ sheath remains in place. The right lung is expanded and clear. Left basilar atelectasis is noted. There is cardiomegaly without edema. IMPRESSION: Tiny left apical pneumothorax after chest tube removal. Left basilar atelectasis. Electronically Signed   By: Drusilla Kanner M.D.   On: 02/18/2017 07:59   Dg Chest Port 1 View  Result Date: 02/17/2017 CLINICAL DATA:  History of CABG EXAM: PORTABLE CHEST 1 VIEW COMPARISON:  Yesterday FINDINGS: Swan-Ganz catheter has been removed. Thoracic drains and right IJ sheath persists. Low volume chest with atelectasis. Stable cardiopericardial enlargement. No edema, effusion, or pneumothorax. IMPRESSION: Stable low volume chest with atelectasis. Electronically Signed   By: Marnee Spring M.D.   On: 02/17/2017 07:48  Cardiac Studies   02/10/2017 CATH Diagnostic Diagram       02/12/2017 ECHO - Left ventricle: The cavity size was normal. There was moderate concentric hypertrophy. Systolic function was moderately to severely reduced. The estimated ejection fraction was in the range of 30% to 35%. Diffuse hypokinesis. Doppler parameters  are consistent with abnormal left ventricular relaxation (grade 1 diastolic dysfunction). Doppler parameters are consistent with indeterminate ventricular filling pressure. - Aortic valve: Transvalvular velocity was within the normal range. There was no stenosis. There was no regurgitation. - Mitral valve: Transvalvular velocity was within the normal range. There was no evidence for stenosis. There was mild regurgitation. - Left atrium: The atrium was mildly dilated. - Right ventricle: The cavity size was normal. Wall thickness was normal. Systolic function was normal. - Atrial septum: No defect or patent foramen ovale was identified. - Tricuspid valve: There was trivial regurgitation. - Pulmonary arteries: Systolic pressure was within the normal range. PA peak pressure: 22 mm Hg (S).   Patient Profile     36 y.o. male with premature onset CAD with multivessel involvement, presenting with a non-STEMI and moderate LV dysfunction (EF 30-35%), now day 3 after CABG  Assessment & Plan    1. Postop cardiogenic shock:Resolved  2. CHF: EF was 30% preop and showed marked improvement by TEE towards ends of surgery.  His weight is back to preoperative level. Currently on a tiny dose of metoprolol and moderate dose lisinopril. BP is great, but would hold off increasing heart failure meds.  Reevaluate EF by echo in a few weeks 3.ZOX:WRUEAV carvedilol and RAASinhibitors for blood pressure control.  4. WUJ:[id number]-JYNW atorvastatin.  For questions or updates, please contact [facility] Please consult [email or web address] for contact info under Cardiology/STEMI.      Signed, Thurmon Fair, MD  02/18/2017, 9:04 AM

## 2017-02-18 NOTE — Progress Notes (Signed)
3 Days Post-Op Procedure(s) (LRB): CORONARY ARTERY BYPASS GRAFTING (CABG), ON PUMP, TIMES FOUR, USING LEFT INTERNAL MAMMARY ARTERY AND ENDOSCOPICALLY HARVESTED RIGHT GREATER SAPHENOUS VEIN (N/A) TRANSESOPHAGEAL ECHOCARDIOGRAM (TEE) (N/A) Subjective: Progressing after CABG - preop MI, LV dysfunction Off milrinone nsr tx to floor   Objective: Vital signs in last 24 hours: Temp:  [97.4 F (36.3 C)-98.9 F (37.2 C)] 97.4 F (36.3 C) (10/25 2300) Pulse Rate:  [87-116] 104 (10/26 0735) Cardiac Rhythm: Sinus tachycardia (10/26 0735) Resp:  [16-40] 31 (10/26 0735) BP: (88-123)/(57-81) 108/76 (10/26 0735) SpO2:  [91 %-100 %] 98 % (10/26 0735) Weight:  [216 lb 11.2 oz (98.3 kg)] 216 lb 11.2 oz (98.3 kg) (10/26 0600)  Hemodynamic parameters for last 24 hours:    Intake/Output from previous day: 10/25 0701 - 10/26 0700 In: 822.6 [P.O.:360; I.V.:212.6; IV Piggyback:250] Out: 5775 [Urine:5675; Chest Tube:100] Intake/Output this shift: No intake/output data recorded.       Exam    General- alert and comfortable   Lungs- clear without rales, wheezes   Cor- regular rate and rhythm, no murmur , gallop   Abdomen- soft, non-tender   Extremities - warm, non-tender, minimal edema   Neuro- oriented, appropriate, no focal weakness   Lab Results:  Recent Labs  02/17/17 0418 02/17/17 1610 02/18/17 0416  WBC 8.8  --  10.4  HGB 9.0* 10.5* 9.5*  HCT 27.6* 31.0* 29.0*  PLT 140*  --  177   BMET:  Recent Labs  02/17/17 0418 02/17/17 1610 02/18/17 0416  NA 136 133* 133*  K 3.8 3.4* 3.3*  CL 98* 87* 90*  CO2 30  --  33*  GLUCOSE 121* 119* 108*  BUN 8 11 12   CREATININE 0.92 1.00 0.94  CALCIUM 8.2*  --  8.7*    PT/INR:  Recent Labs  02/15/17 1447  LABPROT 17.7*  INR 1.47   ABG    Component Value Date/Time   PHART 7.468 (H) 02/16/2017 0200   HCO3 27.3 02/16/2017 0200   TCO2 30 02/17/2017 1610   ACIDBASEDEF 1.0 02/16/2017 0057   O2SAT 71.4 02/18/2017 0415   CBG (last  3)   Recent Labs  02/17/17 1619 02/17/17 2220 02/18/17 0756  GLUCAP 118* 97 112*    Assessment/Plan: S/P Procedure(s) (LRB): CORONARY ARTERY BYPASS GRAFTING (CABG), ON PUMP, TIMES FOUR, USING LEFT INTERNAL MAMMARY ARTERY AND ENDOSCOPICALLY HARVESTED RIGHT GREATER SAPHENOUS VEIN (N/A) TRANSESOPHAGEAL ECHOCARDIOGRAM (TEE) (N/A) Mobilize Diuresis Diabetes control Plan for transfer to step-down: see transfer orders transition to po lasix   LOS: 8 days    [name] [name] 02/18/2017

## 2017-02-19 ENCOUNTER — Inpatient Hospital Stay (HOSPITAL_COMMUNITY): Payer: Self-pay

## 2017-02-19 LAB — COMPREHENSIVE METABOLIC PANEL
ALT: 34 U/L (ref 17–63)
AST: 32 U/L (ref 15–41)
Albumin: 3.7 g/dL (ref 3.5–5.0)
Alkaline Phosphatase: 54 U/L (ref 38–126)
Anion gap: 12 (ref 5–15)
BUN: 19 mg/dL (ref 6–20)
CO2: 31 mmol/L (ref 22–32)
Calcium: 8.9 mg/dL (ref 8.9–10.3)
Chloride: 90 mmol/L — ABNORMAL LOW (ref 101–111)
Creatinine, Ser: 0.98 mg/dL (ref 0.61–1.24)
GFR calc Af Amer: >60 mL/min (ref >60)
GFR calc non Af Amer: >60 mL/min (ref >60)
Glucose, Bld: 102 mg/dL — ABNORMAL HIGH (ref 65–99)
Potassium: 3.6 mmol/L (ref 3.5–5.1)
Sodium: 133 mmol/L — ABNORMAL LOW (ref 135–145)
Total Bilirubin: 1.1 mg/dL (ref 0.3–1.2)
Total Protein: 7.1 g/dL (ref 6.5–8.1)

## 2017-02-19 LAB — CBC
HCT: 31 % — ABNORMAL LOW (ref 39.0–52.0)
Hemoglobin: 10.1 g/dL — ABNORMAL LOW (ref 13.0–17.0)
MCH: 25.9 pg — ABNORMAL LOW (ref 26.0–34.0)
MCHC: 32.6 g/dL (ref 30.0–36.0)
MCV: 79.5 fL (ref 78.0–100.0)
Platelets: 229 10*3/uL (ref 150–400)
RBC: 3.9 MIL/uL — ABNORMAL LOW (ref 4.22–5.81)
RDW: 16.5 % — ABNORMAL HIGH (ref 11.5–15.5)
WBC: 9.2 10*3/uL (ref 4.0–10.5)

## 2017-02-19 LAB — GLUCOSE, CAPILLARY
Glucose-Capillary: 100 mg/dL — ABNORMAL HIGH (ref 65–99)
Glucose-Capillary: 103 mg/dL — ABNORMAL HIGH (ref 65–99)
Glucose-Capillary: 96 mg/dL (ref 65–99)
Glucose-Capillary: 107 mg/dL — ABNORMAL HIGH (ref 65–99)

## 2017-02-19 NOTE — Progress Notes (Signed)
Report was given. VS stable. Belongings with the patients. Family at the bedside.

## 2017-02-19 NOTE — Progress Notes (Signed)
Received patient in room from Unit [facility] Heart,  Walked to chair , gait slightly unsteady, placed on Telemetry  NSR,  Vital signs:  Temp 99.1   HR 102, Resp 22  B/P 126/78 Denies any pain, pacing wires taped, midline surgical incision clean and dry.  Governor Specking, RN

## 2017-02-19 NOTE — Progress Notes (Signed)
[facility] DAILY ICU PROGRESS NOTE                   [address]            [place] [address]          [phone]   4 Days Post-Op Procedure(s) (LRB): CORONARY ARTERY BYPASS GRAFTING (CABG), ON PUMP, TIMES FOUR, USING LEFT INTERNAL MAMMARY ARTERY AND ENDOSCOPICALLY HARVESTED RIGHT GREATER SAPHENOUS VEIN (N/A) TRANSESOPHAGEAL ECHOCARDIOGRAM (TEE) (N/A)  Total Length of Stay:  LOS: 9 days   Subjective: Patient up to chair no complaints this morning, increasing physical activity appropriately, waiting for bed and stepdown Objective: Vital signs in last 24 hours: Temp:  [98 F (36.7 C)-98.7 F (37.1 C)] 98.6 F (37 C) (10/26 2342) Pulse Rate:  [82-108] 99 (10/27 0800) Cardiac Rhythm: Normal sinus rhythm (10/27 0400) Resp:  [15-34] 27 (10/27 0800) BP: (100-128)/(54-86) 116/78 (10/27 0800) SpO2:  [92 %-100 %] 99 % (10/27 0800) Weight:  [212 lb 12.8 oz (96.5 kg)] 212 lb 12.8 oz (96.5 kg) (10/27 0600)  Filed Weights   02/17/17 0530 02/18/17 0600 02/19/17 0600  Weight: 223 lb 1.7 oz (101.2 kg) 216 lb 11.2 oz (98.3 kg) 212 lb 12.8 oz (96.5 kg)    Weight change: -3 lb 14.4 oz (-1.769 kg)   Hemodynamic parameters for last 24 hours:    Intake/Output from previous day: 10/26 0701 - 10/27 0700 In: 1256.6 [P.O.:1200; I.V.:6.6; IV Piggyback:50] Out: 2100 [Urine:2100]  Intake/Output this shift: Total I/O In: 3 [I.V.:3] Out: -   Current Meds: Scheduled Meds: . acetaminophen  1,000 mg Oral Q6H   Or  . acetaminophen (TYLENOL) oral liquid 160 mg/5 mL  1,000 mg Per Tube Q6H  . amiodarone  400 mg Oral BID  . aspirin EC  325 mg Oral Daily   Or  . aspirin  324 mg Per Tube Daily  . atorvastatin  80 mg Oral q1800  . bisacodyl  10 mg Oral Daily   Or  . bisacodyl  10 mg Rectal Daily  . docusate sodium  200 mg Oral Daily  . furosemide  40 mg Oral Daily  . insulin aspart  0-15 Units Subcutaneous TID WC  . insulin aspart  0-5 Units Subcutaneous QHS  . insulin aspart  4  Units Subcutaneous TID WC  . insulin detemir  12 Units Subcutaneous BID  . mouth rinse  15 mL Mouth Rinse BID  . metoprolol tartrate  25 mg Oral BID  . pantoprazole  40 mg Oral Daily  . potassium chloride  20 mEq Oral Daily  . sodium chloride flush  3 mL Intravenous Q12H   Continuous Infusions: . sodium chloride Stopped (02/16/17 1000)  . sodium chloride 250 mL (02/16/17 0800)  . sodium chloride Stopped (02/16/17 1008)  . lactated ringers Stopped (02/16/17 1009)   PRN Meds:.sodium chloride, metoprolol tartrate, ondansetron (ZOFRAN) IV, oxyCODONE, sodium chloride flush, traMADol  General appearance: alert and cooperative Neurologic: intact Heart: regular rate and rhythm, S1, S2 normal, no murmur, click, rub or gallop Lungs: diminished breath sounds bibasilar Abdomen: soft, non-tender; bowel sounds normal; no masses,  no organomegaly Extremities: extremities normal, atraumatic, no cyanosis or edema and Homans sign is negative, no sign of DVT Wound: Sternum stable  Lab Results: CBC: Recent Labs  02/18/17 0416 02/19/17 0351  WBC 10.4 9.2  HGB 9.5* 10.1*  HCT 29.0* 31.0*  PLT 177 229   BMET:  Recent Labs  02/18/17 0416 02/19/17 0351  NA 133* 133*  K 3.3* 3.6  CL 90* 90*  CO2 33* 31  GLUCOSE 108* 102*  BUN 12 19  CREATININE 0.94 0.98  CALCIUM 8.7* 8.9    CMET: Lab Results  Component Value Date   WBC 9.2 02/19/2017   HGB 10.1 (L) 02/19/2017   HCT 31.0 (L) 02/19/2017   PLT 229 02/19/2017   GLUCOSE 102 (H) 02/19/2017   CHOL 233 (H) 02/10/2017   TRIG 209 (H) 02/10/2017   HDL 38 (L) 02/10/2017   LDLCALC 153 (H) 02/10/2017   ALT 34 02/19/2017   AST 32 02/19/2017   NA 133 (L) 02/19/2017   K 3.6 02/19/2017   CL 90 (L) 02/19/2017   CREATININE 0.98 02/19/2017   BUN 19 02/19/2017   CO2 31 02/19/2017   TSH 4.770 (H) 02/10/2017   INR 1.47 02/15/2017   HGBA1C 5.8 (H) 02/12/2017      PT/INR: No results for input(s): LABPROT, INR in the last 72 hours. Radiology:  Dg Chest 2 View  Result Date: 02/19/2017 CLINICAL DATA:  CABG. EXAM: CHEST  2 VIEW COMPARISON:  02/18/2017 FINDINGS: Sequelae of CABG are again identified. Right jugular sheath has been removed. The cardiac silhouette remains mildly enlarged. Left basilar aeration has improved, and there is mild residual atelectasis. Minimal right basilar atelectasis is noted. There is no evidence of edema or sizable pleural effusion. A tiny left apical pneumothorax persists. IMPRESSION: 1. Mild bibasilar atelectasis, improved on the left. 2. Persistent tiny left apical pneumothorax. Electronically Signed   By: Sebastian AcheAllen  Grady M.D.   On: 02/19/2017 08:21     Assessment/Plan: S/P Procedure(s) (LRB): CORONARY ARTERY BYPASS GRAFTING (CABG), ON PUMP, TIMES FOUR, USING LEFT INTERNAL MAMMARY ARTERY AND ENDOSCOPICALLY HARVESTED RIGHT GREATER SAPHENOUS VEIN (N/A) TRANSESOPHAGEAL ECHOCARDIOGRAM (TEE) (N/A) Mobilize Diuresis Diabetes control Plan for transfer waiting for bed orders already written     Delight Ovensdward B Troy Kanouse 02/19/2017 9:19 AM Patient ID: [name] [name], male   DOB: 01/01/1981, 36 y.o.   MRN: [id number]

## 2017-02-20 LAB — COMPREHENSIVE METABOLIC PANEL
ALT: 32 U/L (ref 17–63)
AST: 29 U/L (ref 15–41)
Albumin: 3.6 g/dL (ref 3.5–5.0)
Alkaline Phosphatase: 59 U/L (ref 38–126)
Anion gap: 13 (ref 5–15)
BUN: 26 mg/dL — ABNORMAL HIGH (ref 6–20)
CO2: 29 mmol/L (ref 22–32)
Calcium: 9 mg/dL (ref 8.9–10.3)
Chloride: 92 mmol/L — ABNORMAL LOW (ref 101–111)
Creatinine, Ser: 1.03 mg/dL (ref 0.61–1.24)
GFR calc Af Amer: >60 mL/min (ref >60)
GFR calc non Af Amer: >60 mL/min (ref >60)
Glucose, Bld: 100 mg/dL — ABNORMAL HIGH (ref 65–99)
Potassium: 3.4 mmol/L — ABNORMAL LOW (ref 3.5–5.1)
Sodium: 134 mmol/L — ABNORMAL LOW (ref 135–145)
Total Bilirubin: 1.2 mg/dL (ref 0.3–1.2)
Total Protein: 6.9 g/dL (ref 6.5–8.1)

## 2017-02-20 LAB — GLUCOSE, CAPILLARY
GLUCOSE-CAPILLARY: 119 mg/dL — AB (ref 65–99)
GLUCOSE-CAPILLARY: 102 mg/dL — AB (ref 65–99)
Glucose-Capillary: 107 mg/dL — ABNORMAL HIGH (ref 65–99)
Glucose-Capillary: 100 mg/dL — ABNORMAL HIGH (ref 65–99)
Glucose-Capillary: 94 mg/dL (ref 65–99)

## 2017-02-20 LAB — CBC
HCT: 30.2 % — ABNORMAL LOW (ref 39.0–52.0)
Hemoglobin: 9.8 g/dL — ABNORMAL LOW (ref 13.0–17.0)
MCH: 25.7 pg — ABNORMAL LOW (ref 26.0–34.0)
MCHC: 32.5 g/dL (ref 30.0–36.0)
MCV: 79.1 fL (ref 78.0–100.0)
Platelets: 256 10*3/uL (ref 150–400)
RBC: 3.82 MIL/uL — ABNORMAL LOW (ref 4.22–5.81)
RDW: 16.4 % — ABNORMAL HIGH (ref 11.5–15.5)
WBC: 7.9 10*3/uL (ref 4.0–10.5)

## 2017-02-20 MED ORDER — POTASSIUM CHLORIDE CRYS ER 20 MEQ PO TBCR
40.0000 meq | EXTENDED_RELEASE_TABLET | Freq: Two times a day (BID) | ORAL | Status: AC
  Administered 2017-02-20 – 2017-02-21 (×2): 40 meq via ORAL
  Filled 2017-02-20 (×2): qty 2

## 2017-02-20 MED ORDER — LISINOPRIL 10 MG PO TABS
10.0000 mg | ORAL_TABLET | Freq: Every day | ORAL | Status: DC
  Administered 2017-02-20 – 2017-02-21 (×2): 10 mg via ORAL
  Filled 2017-02-20 (×2): qty 1

## 2017-02-20 NOTE — Progress Notes (Signed)
Progress Note  Patient Name: [name] [name] Date of Encounter: 02/20/2017  Primary Cardiologist: Anne Fu  Subjective   Feeling well, no chest pain, no shortness of breath  Inpatient Medications    Scheduled Meds: . acetaminophen  1,000 mg Oral Q6H   Or  . acetaminophen (TYLENOL) oral liquid 160 mg/5 mL  1,000 mg Per Tube Q6H  . amiodarone  400 mg Oral BID  . aspirin EC  325 mg Oral Daily   Or  . aspirin  324 mg Per Tube Daily  . atorvastatin  80 mg Oral q1800  . bisacodyl  10 mg Oral Daily   Or  . bisacodyl  10 mg Rectal Daily  . docusate sodium  200 mg Oral Daily  . furosemide  40 mg Oral Daily  . lisinopril  10 mg Oral Daily  . mouth rinse  15 mL Mouth Rinse BID  . metoprolol tartrate  25 mg Oral BID  . pantoprazole  40 mg Oral Daily  . potassium chloride  40 mEq Oral BID  . sodium chloride flush  3 mL Intravenous Q12H   Continuous Infusions: . sodium chloride Stopped (02/16/17 1000)  . sodium chloride 250 mL (02/16/17 0800)  . sodium chloride Stopped (02/16/17 1008)  . lactated ringers Stopped (02/16/17 1009)   PRN Meds: sodium chloride, metoprolol tartrate, ondansetron (ZOFRAN) IV, oxyCODONE, sodium chloride flush, traMADol   Vital Signs    Vitals:   02/20/17 0700 02/20/17 0800 02/20/17 1011 02/20/17 1012  BP:   (!) 147/91   Pulse:    (!) 105  Resp: (!) 29 (!) 26    Temp:      TempSrc:      SpO2: 95% 96%    Weight:      Height:        Intake/Output Summary (Last 24 hours) at 02/20/17 1115 Last data filed at 02/20/17 0549  Gross per 24 hour  Intake              360 ml  Output              800 ml  Net             -440 ml   Filed Weights   02/18/17 0600 02/19/17 0600 02/20/17 0542  Weight: 216 lb 11.2 oz (98.3 kg) 212 lb 12.8 oz (96.5 kg) 208 lb 11.2 oz (94.7 kg)    Telemetry    Sinus rhythm heart rate 98- Personally Reviewed  ECG    No new EKG- Personally Reviewed  Physical Exam   GEN: No acute distress.   Neck: No  JVD Cardiac: RRR, no murmurs, rubs, or gallops.  CABG scar noted, well healing Respiratory: Clear to auscultation bilaterally. GI: Soft, nontender, non-distended  MS: No edema; No deformity. Neuro:  Nonfocal  Psych: Normal affect   Labs    Chemistry Recent Labs Lab 02/18/17 0416 02/19/17 0351 02/20/17 0240  NA 133* 133* 134*  K 3.3* 3.6 3.4*  CL 90* 90* 92*  CO2 33* 31 29  GLUCOSE 108* 102* 100*  BUN 12 19 26*  CREATININE 0.94 0.98 1.03  CALCIUM 8.7* 8.9 9.0  PROT 6.3* 7.1 6.9  ALBUMIN 3.6 3.7 3.6  AST 35 32 29  ALT 35 34 32  ALKPHOS 52 54 59  BILITOT 1.2 1.1 1.2  GFRNONAA >60 >60 >60  GFRAA >60 >60 >60  ANIONGAP 10 12 13      Hematology Recent Labs Lab 02/18/17 0416 02/19/17 0351 02/20/17 0240  WBC 10.4 9.2 7.9  RBC 3.67* 3.90* 3.82*  HGB 9.5* 10.1* 9.8*  HCT 29.0* 31.0* 30.2*  MCV 79.0 79.5 79.1  MCH 25.9* 25.9* 25.7*  MCHC 32.8 32.6 32.5  RDW 16.3* 16.5* 16.4*  PLT 177 229 256    Cardiac EnzymesNo results for input(s): TROPONINI in the last 168 hours. No results for input(s): TROPIPOC in the last 168 hours.   BNPNo results for input(s): BNP, PROBNP in the last 168 hours.   DDimer No results for input(s): DDIMER in the last 168 hours.   Radiology    Dg Chest 2 View  Result Date: 02/19/2017 CLINICAL DATA:  CABG. EXAM: CHEST  2 VIEW COMPARISON:  02/18/2017 FINDINGS: Sequelae of CABG are again identified. Right jugular sheath has been removed. The cardiac silhouette remains mildly enlarged. Left basilar aeration has improved, and there is mild residual atelectasis. Minimal right basilar atelectasis is noted. There is no evidence of edema or sizable pleural effusion. A tiny left apical pneumothorax persists. IMPRESSION: 1. Mild bibasilar atelectasis, improved on the left. 2. Persistent tiny left apical pneumothorax. Electronically Signed   By: Sebastian AcheAllen  Grady M.D.   On: 02/19/2017 08:21    Cardiac Studies   Echo-EF 35% pre-bypass, post bypass 55-60% on  TEE intraoperatively cath-severe triple-vessel disease  Patient Profile     36 y.o. male with severe triple-vessel coronary artery disease, ischemic cardiomyopathy improved post bypass, essential hypertension  Assessment & Plan    Essential hypertension -Has room to increase beta-blocker, agree with ACE inhibitor as well.  CAD -Post CABG progressing well.  Continue statin, beta-blocker  Ischemic cardiomyopathy - Per Dr. Jean RosenthalJackson, TEE intraoperatively demonstrated ejection fraction normalization post CABG.  Excellent.  Atrial fibrillation prophylaxis -Continuing with amiodarone, hopeful short course  For questions or updates, please contact [facility] Please consult [email or web address] for contact info under Cardiology/STEMI.      Signed, Donato SchultzMark Maverick Dieudonne, MD  02/20/2017, 11:15 AM

## 2017-02-20 NOTE — Progress Notes (Addendum)
[address]       [place] [address]             [phone]      5 Days Post-Op Procedure(s) (LRB): CORONARY ARTERY BYPASS GRAFTING (CABG), ON PUMP, TIMES FOUR, USING LEFT INTERNAL MAMMARY ARTERY AND ENDOSCOPICALLY HARVESTED RIGHT GREATER SAPHENOUS VEIN (N/A) TRANSESOPHAGEAL ECHOCARDIOGRAM (TEE) (N/A) Subjective: No specific c/o  Objective: Vital signs in last 24 hours: Temp:  [98.3 F (36.8 C)-99.6 F (37.6 C)] 99.6 F (37.6 C) (10/28 0542) Pulse Rate:  [84-109] 84 (10/28 0542) Cardiac Rhythm: Normal sinus rhythm (10/28 0700) Resp:  [17-31] 21 (10/28 0600) BP: (110-143)/(71-86) 143/85 (10/28 0542) SpO2:  [94 %-98 %] 95 % (10/28 0600) Weight:  [208 lb 11.2 oz (94.7 kg)] 208 lb 11.2 oz (94.7 kg) (10/28 0542)  Hemodynamic parameters for last 24 hours:    Intake/Output from previous day: 10/27 0701 - 10/28 0700 In: 843 [P.O.:840; I.V.:3] Out: 800 [Urine:800] Intake/Output this shift: No intake/output data recorded.  General appearance: alert, cooperative and no distress Heart: regular rate and rhythm Lungs: min dim in bases Abdomen: benign Extremities: no edema Wound: incis healing well  Lab Results:  Recent Labs  02/19/17 0351 02/20/17 0240  WBC 9.2 7.9  HGB 10.1* 9.8*  HCT 31.0* 30.2*  PLT 229 256   BMET:  Recent Labs  02/19/17 0351 02/20/17 0240  NA 133* 134*  K 3.6 3.4*  CL 90* 92*  CO2 31 29  GLUCOSE 102* 100*  BUN 19 26*  CREATININE 0.98 1.03  CALCIUM 8.9 9.0    PT/INR: No results for input(s): LABPROT, INR in the last 72 hours. ABG    Component Value Date/Time   PHART 7.468 (H) 02/16/2017 0200   HCO3 27.3 02/16/2017 0200   TCO2 30 02/17/2017 1610   ACIDBASEDEF 1.0 02/16/2017 0057   O2SAT 71.4 02/18/2017 0415   CBG (last 3)   Recent Labs  02/19/17 1630 02/19/17 2001 02/20/17 0629  GLUCAP 96 107* 107*    Meds Scheduled Meds: . acetaminophen  1,000 mg Oral Q6H   Or  . acetaminophen (TYLENOL) oral  liquid 160 mg/5 mL  1,000 mg Per Tube Q6H  . amiodarone  400 mg Oral BID  . aspirin EC  325 mg Oral Daily   Or  . aspirin  324 mg Per Tube Daily  . atorvastatin  80 mg Oral q1800  . bisacodyl  10 mg Oral Daily   Or  . bisacodyl  10 mg Rectal Daily  . docusate sodium  200 mg Oral Daily  . furosemide  40 mg Oral Daily  . insulin aspart  0-15 Units Subcutaneous TID WC  . insulin aspart  0-5 Units Subcutaneous QHS  . insulin aspart  4 Units Subcutaneous TID WC  . insulin detemir  12 Units Subcutaneous BID  . mouth rinse  15 mL Mouth Rinse BID  . metoprolol tartrate  25 mg Oral BID  . pantoprazole  40 mg Oral Daily  . potassium chloride  20 mEq Oral Daily  . sodium chloride flush  3 mL Intravenous Q12H   Continuous Infusions: . sodium chloride Stopped (02/16/17 1000)  . sodium chloride 250 mL (02/16/17 0800)  . sodium chloride Stopped (02/16/17 1008)  . lactated ringers Stopped (02/16/17 1009)   PRN Meds:.sodium chloride, metoprolol tartrate, ondansetron (ZOFRAN) IV, oxyCODONE, sodium chloride flush, traMADol  Xrays Dg Chest 2 View  Result Date: 02/19/2017 CLINICAL DATA:  CABG. EXAM: CHEST  2 VIEW COMPARISON:  02/18/2017 FINDINGS: Sequelae of CABG are again identified. Right jugular sheath has been removed. The cardiac silhouette remains mildly enlarged. Left basilar aeration has improved, and there is mild residual atelectasis. Minimal right basilar atelectasis is noted. There is no evidence of edema or sizable pleural effusion. A tiny left apical pneumothorax persists. IMPRESSION: 1. Mild bibasilar atelectasis, improved on the left. 2. Persistent tiny left apical pneumothorax. Electronically Signed   By: Sebastian AcheAllen  Grady M.D.   On: 02/19/2017 08:21    Assessment/Plan: S/P Procedure(s) (LRB): CORONARY ARTERY BYPASS GRAFTING (CABG), ON PUMP, TIMES FOUR, USING LEFT INTERNAL MAMMARY ARTERY AND ENDOSCOPICALLY HARVESTED RIGHT GREATER SAPHENOUS VEIN (N/A) TRANSESOPHAGEAL ECHOCARDIOGRAM (TEE)  (N/A)  1 Doing well 2 SBP to 140's at times, will add low dose ACE-I 3 d/c insulin and see how sugars do, A1C is 5.7 on no meds at home- needs good dietary management of carb intake 4 d/c wires 5 sinus with PVC's- replace K+, he is on amio and beta blocker currently 6 poss home in am  LOS: 10 days    GOLD,WAYNE E 02/20/2017  Patient is slowly improving Will need arrangements for post medical care, and to obtain medications.  Social worker reported he could not get any medications until Monday Possible home tomorrow I have seen and examined Alvah Negrete-Ramirez and agree with the above assessment  and plan.  Delight OvensEdward B Kristy Schomburg MD Beeper [phone] Office [phone] 02/20/2017 11:54 AM

## 2017-02-20 NOTE — Progress Notes (Signed)
Patient EPW pulled per protocol and as ordered all ends intact. Slight oozing from right atrial wire site. Gauze and pressure applied and oozing subsided. Patient reminded to lie supine approximately one hour. bp 105/62 will monitor patient . Correll Denbow, Randall An rN

## 2017-02-21 LAB — COMPREHENSIVE METABOLIC PANEL
ALT: 32 U/L (ref 17–63)
AST: 31 U/L (ref 15–41)
Albumin: 3.7 g/dL (ref 3.5–5.0)
Alkaline Phosphatase: 70 U/L (ref 38–126)
Anion gap: 9 (ref 5–15)
BUN: 28 mg/dL — ABNORMAL HIGH (ref 6–20)
CO2: 31 mmol/L (ref 22–32)
Calcium: 8.9 mg/dL (ref 8.9–10.3)
Chloride: 95 mmol/L — ABNORMAL LOW (ref 101–111)
Creatinine, Ser: 1.06 mg/dL (ref 0.61–1.24)
GFR calc Af Amer: >60 mL/min (ref >60)
GFR calc non Af Amer: >60 mL/min (ref >60)
Glucose, Bld: 102 mg/dL — ABNORMAL HIGH (ref 65–99)
Potassium: 3.7 mmol/L (ref 3.5–5.1)
Sodium: 135 mmol/L (ref 135–145)
Total Bilirubin: 1.2 mg/dL (ref 0.3–1.2)
Total Protein: 7.1 g/dL (ref 6.5–8.1)

## 2017-02-21 LAB — MAGNESIUM: Magnesium: 2.4 mg/dL (ref 1.7–2.4)

## 2017-02-21 LAB — CBC
HCT: 31.5 % — ABNORMAL LOW (ref 39.0–52.0)
Hemoglobin: 10.2 g/dL — ABNORMAL LOW (ref 13.0–17.0)
MCH: 26.1 pg (ref 26.0–34.0)
MCHC: 32.4 g/dL (ref 30.0–36.0)
MCV: 80.6 fL (ref 78.0–100.0)
Platelets: 287 10*3/uL (ref 150–400)
RBC: 3.91 MIL/uL — ABNORMAL LOW (ref 4.22–5.81)
RDW: 16.5 % — ABNORMAL HIGH (ref 11.5–15.5)
WBC: 8.5 10*3/uL (ref 4.0–10.5)

## 2017-02-21 LAB — GLUCOSE, CAPILLARY
Glucose-Capillary: 96 mg/dL (ref 65–99)
Glucose-Capillary: 107 mg/dL — ABNORMAL HIGH (ref 65–99)

## 2017-02-21 MED ORDER — METOPROLOL TARTRATE 25 MG PO TABS: 25.0000 mg | ORAL_TABLET | Freq: Two times a day (BID) | ORAL | 3 refills | Status: DC

## 2017-02-21 MED ORDER — ASPIRIN 325 MG PO TBEC: 325.0000 mg | DELAYED_RELEASE_TABLET | Freq: Every day | ORAL | 0 refills | Status: DC

## 2017-02-21 MED ORDER — LISINOPRIL 10 MG PO TABS: 10.0000 mg | ORAL_TABLET | Freq: Every day | ORAL | 3 refills | Status: DC

## 2017-02-21 MED ORDER — AMIODARONE HCL 400 MG PO TABS: 400.0000 mg | ORAL_TABLET | Freq: Every day | ORAL | 0 refills | Status: DC

## 2017-02-21 MED ORDER — ATORVASTATIN CALCIUM 80 MG PO TABS: 80.0000 mg | ORAL_TABLET | Freq: Every day | ORAL | 3 refills | Status: DC

## 2017-02-21 MED ORDER — POTASSIUM CHLORIDE CRYS ER 20 MEQ PO TBCR: 20.0000 meq | EXTENDED_RELEASE_TABLET | Freq: Every day | ORAL | 0 refills | Status: DC

## 2017-02-21 MED ORDER — FUROSEMIDE 40 MG PO TABS: 40.0000 mg | ORAL_TABLET | Freq: Every day | ORAL | 0 refills | Status: DC

## 2017-02-21 MED ORDER — OXYCODONE HCL 5 MG PO TABS: 5.0000 mg | ORAL_TABLET | ORAL | 0 refills | Status: DC | PRN

## 2017-02-21 MED ORDER — AMIODARONE HCL 400 MG PO TABS: 400.0000 mg | ORAL_TABLET | Freq: Two times a day (BID) | ORAL | 3 refills | Status: DC

## 2017-02-21 MED FILL — LISINOPRIL 10 MG TABS: 10 | 30 days supply | Qty: 30 | Fill #0

## 2017-02-21 MED FILL — ?FUROSEMIDE 40 MG TABLET: 40 | 7 days supply | Qty: 7 | Fill #0

## 2017-02-21 MED FILL — POTASSIUM CL ER 20 MEQ TAB: 20 | 7 days supply | Qty: 7 | Fill #0

## 2017-02-21 MED FILL — ATORVASTATIN 80 MG TABLET: 80 | 30 days supply | Qty: 30 | Fill #0

## 2017-02-21 MED FILL — ?METOPROLOL 25 MG TABLET: 25 | 30 days supply | Qty: 60 | Fill #0

## 2017-02-21 NOTE — Progress Notes (Addendum)
      [address]       [place] [address]             [phone]      6 Days Post-Op Procedure(s) (LRB): CORONARY ARTERY BYPASS GRAFTING (CABG), ON PUMP, TIMES FOUR, USING LEFT INTERNAL MAMMARY ARTERY AND ENDOSCOPICALLY HARVESTED RIGHT GREATER SAPHENOUS VEIN (N/A) TRANSESOPHAGEAL ECHOCARDIOGRAM (TEE) (N/A)   Subjective:  Patient doing well.  He has no specific complaints this morning.  He and his wife have concerns about discharge.  All questions were addressed.  Translation provided by Marsh Dolly who is a Agricultural engineer on the floor.  Objective: Vital signs in last 24 hours: Temp:  [98.1 F (36.7 C)-99.2 F (37.3 C)] 98.1 F (36.7 C) (10/29 0500) Pulse Rate:  [82-107] 82 (10/29 0500) Cardiac Rhythm: Normal sinus rhythm (10/29 0500) Resp:  [17-32] 17 (10/29 0500) BP: (105-147)/(62-91) 133/84 (10/29 0500) SpO2:  [95 %-96 %] 96 % (10/29 0500) Weight:  [206 lb 11.2 oz (93.8 kg)] 206 lb 11.2 oz (93.8 kg) (10/29 0659)  Intake/Output from previous day: 10/28 0701 - 10/29 0700 In: 366 [P.O.:360; I.V.:6] Out: 1100 [Urine:1100]  General appearance: alert, cooperative and no distress Heart: regular rate and rhythm Lungs: clear to auscultation bilaterally Abdomen: soft, non-tender; bowel sounds normal; no masses,  no organomegaly Extremities: edema trace Wound: clean and dry, ecchymosis of RLE  Lab Results:  Recent Labs  02/20/17 0240 02/21/17 0347  WBC 7.9 8.5  HGB 9.8* 10.2*  HCT 30.2* 31.5*  PLT 256 287   BMET:  Recent Labs  02/20/17 0240 02/21/17 0347  NA 134* 135  K 3.4* 3.7  CL 92* 95*  CO2 29 31  GLUCOSE 100* 102*  BUN 26* 28*  CREATININE 1.03 1.06  CALCIUM 9.0 8.9    PT/INR: No results for input(s): LABPROT, INR in the last 72 hours. ABG    Component Value Date/Time   PHART 7.468 (H) 02/16/2017 0200   HCO3 27.3 02/16/2017 0200   TCO2 30 02/17/2017 1610   ACIDBASEDEF 1.0 02/16/2017 0057   O2SAT 71.4 02/18/2017 0415   CBG  (last 3)   Recent Labs  02/20/17 2056 02/20/17 2210 02/21/17 0656  GLUCAP 100* 94 96    Assessment/Plan: S/P Procedure(s) (LRB): CORONARY ARTERY BYPASS GRAFTING (CABG), ON PUMP, TIMES FOUR, USING LEFT INTERNAL MAMMARY ARTERY AND ENDOSCOPICALLY HARVESTED RIGHT GREATER SAPHENOUS VEIN (N/A) TRANSESOPHAGEAL ECHOCARDIOGRAM (TEE) (N/A)  1. CV- NSR- continue Lopressor and Lisinopril 2. Pulm- no acute issues, continue IS 3. Renal- creatinine has been WNL, weight is trending down, will taper Lasix 4. Dispo- patient stable, will d/c home today   LOS: 11 days    BARRETT, ERIN 02/21/2017  patient examined and medical record reviewed,agree with above note. He is medically ready for discharge Instructions on meds, activity, wound care and heart-health lifestyle   reviewed with patient Kathlee Nations Trigt III 02/21/2017

## 2017-02-21 NOTE — Progress Notes (Signed)
Patient in a stable condition,discharge education reviewed with patient and his wife interpretation provided by  Candace Cruise RN (6E), patient and family verbalized understanding, iv removed, tele dc ccmd notified, patient belongings at bedside, patient to be transported home by his wife

## 2017-02-21 NOTE — Progress Notes (Signed)
CARDIAC REHAB PHASE I   Pt has been walking independently. Ed completed with pt and wife through interpreter (family member and RN signed waiver). Good reception. Discussed sternal precautions, IS, mobility, diet (including carb modified due to 5.8 A1C), daily wts, and CPRII. Will refer to [place] CRPII (or maintenance due to lack of insurance).  [id number]  Harriet Masson CES, ACSM 02/21/2017 12:06 PM

## 2017-02-21 NOTE — Discharge Instructions (Signed)
Injerto de revascularizacin arterial coronaria, cuidados posteriores (Coronary Artery Bypass Grafting, Care After) Estas indicaciones le proporcionan informacin general acerca de cmo deber cuidarse despus del procedimiento. El mdico tambin podr darle instrucciones especficas. Comunquese con el mdico si tiene algn problema o tiene preguntas despus del procedimiento. CUIDADOS EN EL HOGAR  Tome los medicamentos solamente como se lo haya indicado el mdico. Tome los medicamentos exactamente como se lo hayan indicado. No deje de tomar los medicamentos ni empiece a tomar medicamentos nuevos sin hablar primero con el mdico.  Tmese el pulso como se lo haya indicado el mdico.  Haga ejercicios de respiracin profunda como se lo haya indicado el mdico. Use el dispositivo de respiracin (espirmetro de incentivo), si se lo dieron, para Education administrator los ejercicios de respiracin profunda varias veces al da. Presione su pecho con una almohada o sus brazos al respirar profundamente y toser.  Mantenga limpia, seca y protegida la zona donde se realizaron los cortes quirrgicos (incisiones). Retire los vendajes solamente como se lo haya indicado el mdico. Si le pusieron tiras en la zona de la ciruga, no las retire. Estas se caen solas.  Revise diariamente la zona de la ciruga para ver si est inflamada (hinchada) o enrojecida, o si hay secrecin de lquido.  Si los cortes quirrgicos se hicieron en las piernas: ? Evite cruzar las piernas. ? Evite estar sentado durante largos perodos. Cambie de posicin cada . ? Eleve las piernas cuando est sentado. Apyelas sobre almohadas.  Use medias que ayuden a evitar que se le formen cogulos sanguneos en las piernas (medias de compresin).  Tome solamente baos de esponja hasta que el mdico le permita ducharse. Seque la zona de la ciruga con golpecitos suaves. No la frote con un pao ni con una toalla. No se d baos de inmersin, no  practique natacin ni use el jacuzzi hasta que el mdico lo autorice.  Coma alimentos ricos en fibra, entre ellos, frutas y verduras crudas, cereales integrales, frijoles y frutos secos. Elija las carnes [place]. Evite los alimentos enlatados, procesados y fritos.  Beba suficiente lquido para mantener el pis (orina) claro o de color amarillo plido.  Controle su peso a diario.  Descanse y limite sus actividades como se lo haya indicado el mdico. Es posible que le indiquen que: ? Interrumpa cualquier actividad si tiene [employer]ck [employer] de [place], falta de [place], [employer] Services latidos del corazn o [place]. Si esto ocurre, solicite ayuda inmediatamente. ? Muvase con frecuencia durante breves perodos de tiempo o haga caminatas cortas como se lo haya indicado el mdico. Aumente gradualmente el nivel de Gamaliel. Es posible que necesite ayuda para Therapist, music los msculos y aumentar la resistencia. ? Evite levantar, empujar o tirar de objetos que pesen ms de 10libras (4.5kg) durante al menos 6semanas despus de la [place].  No conduzca hasta que el mdico lo autorice.  Pregntele al mdico cundo puede volver a Printmaker.  Pregntele al mdico cundo puede reanudar la actividad sexual.  Concurra a las consultas de control con el mdico, segn las indicaciones.  SOLICITE AYUDA SI:  Tiene inflamacin, enrojecimiento, ms dolor o secrecin de lquido en el lugar de la incisin.  Tiene fiebre.  Se le hinchan los tobillos o las piernas.  Siente dolor en las piernas.  Aumenta 2libras (0.9kg) o ms por da.  Tiene malestar estomacal (nuseas) o vomita.  La materia fecal es lquida (diarrea).  SOLICITE AYUDA DE INMEDIATO SI:  Tiene dolor de pecho que se extiende [employer] o  brazos.  Le falta el aire.  Tiene latidos cardacos rpidos e irregulares.  Percibe un "chasquido" en el esternn cuando se mueve.  Siente debilidad o adormecimiento en los brazos o en las piernas.  Se siente  mareado o aturdido.  ASEGRESE DE QUE:  Comprende estas instrucciones.  Controlar su afeccin.  Recibir ayuda de inmediato si no mejora o si empeora.  Esta informacin no tiene Theme park managercomo fin reemplazar el consejo del mdico. Asegrese de hacerle al mdico cualquier pregunta que tenga. Document Released: 04/17/2013 Document Revised: 04/17/2013 Document Reviewed: 09/19/2012 Elsevier Interactive Patient Education  2017 Elsevier Inc.  Dieta restringida en grasas y colesterol (Fat and Cholesterol Restricted Diet) Los niveles altos de grasa y Oncologistcolesterol en la sangre pueden causar varios problemas de salud, como enfermedades del corazn, de los vasos sanguneos, de la Riversidevescula, del hgado y del pncreas. Las grasas son fuentes de Engineer, drillingenerga concentrada que existen en varias formas. Consumir en exceso ciertos tipos de grasa, incluidas las grasas saturadas, puede ser perjudicial. El colesterol es una sustancia que el organismo necesita en pequeas cantidades. El cuerpo Coc[place] ([place]) Islandsfabrica todo el colesterol que necesita. El exceso de colesterol proviene de los alimentos que come. Si sus niveles de colesterol y grasas saturadas en la sangre son elevados, puede tener problemas de salud, dado que el exceso de grasa y Oncologistcolesterol se acumula en las paredes de los vasos sanguneos, provocando su Scientist, research (medical)estrechamiento. Elegir los Public house manageralimentos apropiados le permitir controlar su ingesta de grasa y Decaturcolesterol. Esto le ayudar a Calpine Corporationmantener los niveles de estas sustancias en la sangre dentro de los lmites normales y a reducir el riesgo de Biochemist, clinicalcontraer enfermedades. EN QU CONSISTE EL PLAN? El mdico le recomienda que:  Limite la ingesta de grasas a alrededor del _______% del total de caloras por da.  Limite la cantidad de colesterol en su dieta a menos de _________mg Karie Chimerapor da.  Consuma entre 20 y 30gramos de Rohm and Haasfibra todos los das. QU TIPOS DE GRASAS DEBO ELEGIR?  Elija grasas saludables con mayor frecuencia. Elija las grasas  monoinsaturadas y [address]poliinsaturadas, como el aceite de oliva y canola, las semillas de [place] Seasonslino, las nueces, las almendras y las semillas.  Consuma ms grasas omega-3. Las mejores opciones incluyen salmn, caballa, sardinas, atn, aceite de lino y semillas de lino molidas. Trate de consumir pescado al Borders Groupmenos dos veces por semana.  Limite el consumo de grasas saturadas. Estas se encuentran principalmente en los productos de origen animal, como las carnes, la [place] Pleasantmantequilla y la crema. Las grasas saturadas de origen vegetal incluyen aceite de palma, de palmiste y de coco.  Evite los alimentos con aceites parcialmente hidrogenados. Estos contienen grasas trans. Entre los ejemplos de alimentos con grasas trans se incluyen margarinas en barra, algunas margarinas untables, galletas dulces o saladas y otros productos horneados.  QU PAUTAS GENERALES DEBO SEGUIR? Estas pautas para una alimentacin saludable le ayudarn a controlar su ingesta de grasa y colesterol:  Lea las etiquetas de los alimentos detenidamente para identificar los que contienen grasas trans o altas cantidades de grasas saturadas.  Llene la mitad del plato con verduras y ensaladas de hojas verdes.  Llene un cuarto del plato con cereales integrales. Busque la palabra "integral" en Estate agentel primer lugar de la lista de ingredientes.  Llene un cuarto del plato con alimentos con protenas magras.  Limite las frutas a dos porciones por da. Elija frutas en lugar de jugos.  Coma ms alimentos que contienen fibra, como Cadyvillemanzanas, Deercroftbrcoli, Doniphanzanahorias, frijoles, guisantes y Qatarcebada.  Consuma ms comida  casera y menos de restaurante, de buf y comida rpida.  Limite o evite el alcohol.  Limite los alimentos con alto contenido de almidn y International aid/development worker.  Limite el consumo de alimentos fritos.  Cocine los alimentos utilizando mtodos que no sean la fritura. Las opciones de coccin ms [place] son Development worker, community, Regulatory affairs officer, Software engineer y asar a Patent attorney.  Baje de peso  si es necesario. Perder solo del 5 al 10% de su peso inicial puede ayudarle a mejorar su estado de salud general y a Education officer, museum, como la diabetes y las enfermedades cardacas. QU ALIMENTOS PUEDO COMER? Cereales Cereales integrales, como los panes de salvado o [place], las [place], los cereales y las pastas. Avena sin endulzar, trigo, [place], quinua o arroz integral. Tortillas de harina de maz o de salvado. Verduras Verduras frescas o congeladas (crudas, al vapor, asadas o grilladas). Ensaladas de hojas verdes. Nils Pyle Nils Pyle frescas, en conserva (en su jugo natural) o frutas congeladas. Carnes y otros productos con protenas Carne de res molida (al 85% o ms [place]), carne de res de animales alimentados con pastos o carne de res sin la grasa. Pollo o pavo sin piel. Carne de pollo o de [place]. Cerdo sin la grasa. Todos los pescados y frutos de mar. Huevos. Porotos, guisantes o lentejas secos. Frutos secos o semillas sin sal. Frijoles secos o en lata sin sal. Lcteos Productos lcteos con bajo contenido de grasas, como [place] o al 1%, quesos reducidos en grasas o al 2%, ricota con bajo contenido de grasas o [employer], o yogur natural con bajo contenido de [place]. Grasas y Hershey Company untables que no contengan grasas trans. Mayonesa y condimentos para ensaladas livianos o reducidos en grasas. [place]. Aceites de oliva, canola, ssamo o crtamo. Mantequilla natural de cacahuate o almendra (elija la que no tenga agregado de aceite o azcar). Los artculos mencionados arriba pueden no ser Raytheon de las bebidas o los alimentos recomendados. Comunquese con el nutricionista para conocer ms opciones. QU ALIMENTOS NO SE RECOMIENDAN? Cereales Pan blanco. Pastas blancas. Arroz blanco. Pan de maz. Bagels, pasteles y croissants. Galletas saladas que contengan grasas trans. Verduras Papas blancas. MazHoover Brunette con crema o fritas. Verduras en salsa  de [place]. Nils Pyle Frutas secas. Fruta enlatada en almbar liviano o espeso. Jugo de frutas. Carnes y otros productos con protenas Cortes de carne con Holiday representative. Costillas, alas de pollo, tocineta, salchicha, mortadela, salame, chinchulines, tocino, perros calientes, salchichas alemanas y embutidos envasados. Hgado y otros rganos. Lcteos Leche entera o al 2%, crema, mezcla de [place] y crema, y queso crema. Quesos enteros. Yogur entero o endulzado. Quesos con toda su grasa. Cremas no lcteas y coberturas batidas. Quesos procesados, quesos para untar o cuajadas. Dulces y postres Jarabe de maz, azcares, miel y Radio broadcast assistant. Caramelos. Mermelada y [place]. Doreen Beam. Cereales endulzados. Galletas, pasteles, bizcochuelos, donas, muffins y helado. Grasas y [address], [place] en barra, [place] de [place], [place], [place] clarificada o grasa de tocino. Aceites de coco, de palmiste o de palma. Bebidas Alcohol. Bebidas endulzadas (como refrescos, limonadas y bebidas frutales o ponches). Los artculos mencionados arriba pueden no ser Raytheon de las bebidas y los alimentos que se Theatre stage manager. Comunquese con el nutricionista para obtener ms informacin. Esta informacin no tiene Theme park manager el consejo del mdico. Asegrese de hacerle al mdico cualquier pregunta que tenga. Document Released: 04/12/2005 Document Revised: 05/03/2014 Document Reviewed: 07/11/2013 Elsevier Interactive Patient Education  2017 Arvin[employer]itor.

## 2017-02-21 NOTE — Progress Notes (Signed)
Chest tube sutures removed as ordered, steri tapes applied to site.

## 2017-02-21 NOTE — Care Management Note (Signed)
Case Management Note Original Note by: Leone Haven, RN 02/16/2017, 5:58 PM  Patient Details  Name: [name] [name] MRN: [id number] Date of Birth: 09-24-1980  Subjective/Objective:    From home with family, wife will be with him at dc, presents with chest pain, s/p heart cath shows severe 3 vessel dz, for surgery consult for CABG, for possible CABG on Tues 10/23. No PCP , no insurance on file.  [name] [name] may need medication ast at discharge.     10/24 1758 Letha Cape RN, BSN - POD 1 CABG, acute cardiogenic shock after sternal incision, conts on chest tube, NG tube, extubated to 4 liters today, conts on milrinone, nitro, morphine iv prn, iv abx, lasix iv, lopressor iv prn.   10/26 1012 Letha Cape RN,BSN- POD 3 CABG, NCM used video interpreter , informed patient that [name] [name] has hospital follow up apt at [facility] clinic on 11/2 at 8 am, and if [name] [name] is dc Mon- Fri , [name] [name] will need to go there for medication ast if needed , [name] [name] states [name] [name] is not [place] citizen, not eligible for Match.  Hopefully will be able to dc Mon - Friday for assist at [facility] clinic.                              Action/Plan: NCM will cont to follow for dc needs.   Expected Discharge Date:  02/21/17               Expected Discharge Plan:  Home/Self Care  In-House Referral:     Discharge planning Services  CM Consult, MATCH Program, Medication Assistance, Indigent Health Clinic  Post Acute Care Choice:    Choice offered to:     DME Arranged:    DME Agency:     HH Arranged:    HH Agency:     Status of Service:  Completed, signed off  If discussed at [employer] of [employer], dates discussed:    Additional Comments:  02/21/17 J. Addasyn Mcbreen, RN, BSN Pt medically stable for discharge home today with spouse.    Pt is uninsured, but is eligible for medication assistance through [facility] program. [facility] letter given with explanation of program benefits.     Quintella Baton, RN, BSN  Trauma/Neuro ICU Case  Manager [phone]

## 2017-02-21 NOTE — Progress Notes (Signed)
Progress Note  Patient Name: [name] AskewSalvador Negrete-Ramirez Date of Encounter: 02/21/2017  Primary Cardiologist:   Anne FuSkains  Subjective   No chest pain or SOB.   Inpatient Medications    Scheduled Meds: . amiodarone  400 mg Oral BID  . aspirin EC  325 mg Oral Daily   Or  . aspirin  324 mg Per Tube Daily  . atorvastatin  80 mg Oral q1800  . bisacodyl  10 mg Oral Daily   Or  . bisacodyl  10 mg Rectal Daily  . docusate sodium  200 mg Oral Daily  . furosemide  40 mg Oral Daily  . lisinopril  10 mg Oral Daily  . mouth rinse  15 mL Mouth Rinse BID  . metoprolol tartrate  25 mg Oral BID  . pantoprazole  40 mg Oral Daily  . potassium chloride  40 mEq Oral BID  . sodium chloride flush  3 mL Intravenous Q12H   Continuous Infusions: . sodium chloride Stopped (02/16/17 1000)  . sodium chloride 250 mL (02/16/17 0800)  . sodium chloride Stopped (02/16/17 1008)  . lactated ringers Stopped (02/16/17 1009)   PRN Meds: sodium chloride, metoprolol tartrate, ondansetron (ZOFRAN) IV, oxyCODONE, sodium chloride flush, traMADol   Vital Signs    Vitals:   02/20/17 2044 02/20/17 2051 02/21/17 0500 02/21/17 0659  BP: 120/72  133/84   Pulse: 88  82   Resp: (!) 25  17   Temp: 99.2 F (37.3 C) 98.1 F (36.7 C) 98.1 F (36.7 C)   TempSrc: Oral  Oral   SpO2: 95%  96%   Weight:    206 lb 11.2 oz (93.8 kg)  Height:        Intake/Output Summary (Last 24 hours) at 02/21/17 0753 Last data filed at 02/21/17 0654  Gross per 24 hour  Intake              366 ml  Output             1100 ml  Net             -734 ml   Filed Weights   02/19/17 0600 02/20/17 0542 02/21/17 0659  Weight: 212 lb 12.8 oz (96.5 kg) 208 lb 11.2 oz (94.7 kg) 206 lb 11.2 oz (93.8 kg)    Telemetry    NSR - Personally Reviewed  ECG    NA - Personally Reviewed  Physical Exam   GEN: No acute distress.   Neck: No  JVD Cardiac: RRR, no murmurs, rubs, or gallops.  Respiratory: Clear  to auscultation  bilaterally. GI: Soft, nontender, non-distended  MS: No  edema; No deformity. Neuro:  Nonfocal  Psych: Normal affect   Labs    Chemistry Recent Labs Lab 02/19/17 0351 02/20/17 0240 02/21/17 0347  NA 133* 134* 135  K 3.6 3.4* 3.7  CL 90* 92* 95*  CO2 31 29 31   GLUCOSE 102* 100* 102*  BUN 19 26* 28*  CREATININE 0.98 1.03 1.06  CALCIUM 8.9 9.0 8.9  PROT 7.1 6.9 7.1  ALBUMIN 3.7 3.6 3.7  AST 32 29 31  ALT 34 32 32  ALKPHOS 54 59 70  BILITOT 1.1 1.2 1.2  GFRNONAA >60 >60 >60  GFRAA >60 >60 >60  ANIONGAP 12 13 9      Hematology Recent Labs Lab 02/19/17 0351 02/20/17 0240 02/21/17 0347  WBC 9.2 7.9 8.5  RBC 3.90* 3.82* 3.91*  HGB 10.1* 9.8* 10.2*  HCT 31.0* 30.2* 31.5*  MCV 79.5  79.1 80.6  MCH 25.9* 25.7* 26.1  MCHC 32.6 32.5 32.4  RDW 16.5* 16.4* 16.5*  PLT 229 256 287    Cardiac EnzymesNo results for input(s): TROPONINI in the last 168 hours. No results for input(s): TROPIPOC in the last 168 hours.   BNPNo results for input(s): BNP, PROBNP in the last 168 hours.   DDimer No results for input(s): DDIMER in the last 168 hours.   Radiology    No results found.  Cardiac Studies   TEE :  - Left ventricle: Systolic function was moderately reduced. The   estimated ejection fraction was in the range of 35% to 45%.   Diffuse global hypokinesis. - Staged echo: Limited Post- CPB exam: Significantly Improved LV   function. Vigorous, concentric LV function with no wall motion   abnormalities. EF 55-65%. RV function appears normal as well. No   change post bypass in aortic valve function. No significant   change post bypass in mitral valve function.   Patient Profile     36 y.o. male with severe triple-vessel coronary artery disease, ischemic cardiomyopathy improved post bypass, essential hypertension Assessment & Plan    CAD:  CABG.  Discussed post op plans and meds.   HTN:  BP OK.    RISK REDUCTION:  Agree with current dose of Lipitor.  We will follow.     ATRIAL FIB:  Continue amiodarone for one week and then can discontinue.  We will follow up in clinic.  He had only brief atrial fib post op.     Signed, Rollene Rotunda, MD  02/21/2017, 7:53 AM

## 2017-02-25 ENCOUNTER — Ambulatory Visit: Payer: Self-pay | Admitting: Family Medicine

## 2017-03-10 NOTE — Progress Notes (Signed)
Cardiology Office Note   Date:  03/11/2017   ID:  [name] [name], DOB 09/15/1980, MRN [id number]  PCP:  [name] Lipps, MD  Cardiologist:  Dr. Anne [name]    Chief Complaint  Patient presents with  . Hospitalization Follow-up    CABG      History of Present Illness: [name] [name] is a 36 y.o. male who presents for post hospitalization with NSTEMI, severe CAD and CABG.   He has hx of HTN, GERD.  Interpreter is with him today along with his wife.   On arrival to ER EKG with SR and LVH and J-point elevation in V1-V2 and diffuse ST segment depression and T wave inversions his initial troponin was 10 and K+ 2.8.   He was taken to cath lab urgently  And found to have severe 3 vessel disease prox LAD 90% stenosed, dLAD 100% stenosed, ramus 99% stenosed, prox LCX 100% stenosed, 1st mrg 90% stenosed mid RCA 80% stenosed.   EF 40-45%.  On Echo EF 30-35%  G1DD .  Pt seen by Dr. Maren [name] and plans made for CABG.   He continued on IV heparin.  On 02/15/17 he underwent CABG X 4 with LIMA to LAD; SVG to diag; SVG to ramus intermediate; and SVG to RCA.  He did develop acute cardiogenic shock after sternal incision with urgent application of cardiopulmonary bypass. He improved over next several days --he did receive 2 units PRBC.   He did have brief a fib post op and was placed on amiodarone for 1 week then will discharge.  Pt discharged on the 29th of OCT.    Today he is doing well, he has no chest pain except some muscular pain on occ.  No SOB. He has occ skipped beats. He did stop the amiodarone after 7 days and the lasix and K+.  His EF was decreased and plan for follow up echo.   His appetite is good,  No fevers, colds, lightheadedness. He is walking some every day.  Has some blood in his stools with straining.  Will add stool softener.     Past Medical History:  Diagnosis Date  . Essential hypertension, benign     Past Surgical History:  Procedure Laterality Date  .  CORONARY ARTERY BYPASS GRAFTING (CABG), ON PUMP, TIMES FOUR, USING LEFT INTERNAL MAMMARY ARTERY AND ENDOSCOPICALLY HARVESTED RIGHT GREATER SAPHENOUS VEIN N/A 02/15/2017   Performed by [name] Perna, MD at [facility] OR  . LEFT HEART CATH AND CORONARY ANGIOGRAPHY N/A 02/10/2017   Performed by [name] Kendall, MD at [facility] INVASIVE CV LAB  . TRANSESOPHAGEAL ECHOCARDIOGRAM (TEE) N/A 02/15/2017   Performed by [name] Perna, MD at [facility] OR     Current Outpatient Medications  Medication Sig Dispense Refill  . aspirin EC 325 MG EC tablet Take 1 tablet (325 mg total) by mouth daily. 30 tablet 0  . atorvastatin (LIPITOR) 80 MG tablet Take 1 tablet (80 mg total) by mouth daily at 6 PM. 30 tablet 3  . clotrimazole (LOTRIMIN) 1 % cream Apply 1 application topically 2 (two) times daily. 30 g 0  . furosemide (LASIX) 40 MG tablet Take 1 tablet (40 mg total) by mouth daily. 7 tablet 0  . lisinopril (PRINIVIL,ZESTRIL) 10 MG tablet Take 1 tablet (10 mg total) by mouth daily. 30 tablet 3  . metoprolol tartrate (LOPRESSOR) 25 MG tablet Take 1 tablet (25 mg total) by mouth 2 (two) times daily. 60 tablet 3  . omeprazole (PRILOSEC) 20 MG  capsule Take 1 capsule (20 mg total) by mouth 2 (two) times daily before a meal. 60 capsule 2  . oxyCODONE (OXY IR/ROXICODONE) 5 MG immediate release tablet Take 1-2 tablets (5-10 mg total) by mouth every 4 (four) hours as needed for severe pain. 30 tablet 0   No current facility-administered medications for this visit.     Allergies:   Patient has no known allergies.    Social History:  The patient  reports that  has never smoked. he has never used smokeless tobacco. He reports that he drinks alcohol. He reports that he does not use drugs.   Family History:  The patient's family history includes Diabetes in his brother and sister.    ROS:  General:no colds or fevers, + weight loss after surgery.  Skin:no rashes or ulcers HEENT:no blurred vision, no congestion CV:see HPI PUL:see  HPI GI:no diarrhea, some constipation with occ bright blood no melena, no indigestion GU:no hematuria, no dysuria MS:no joint pain, no claudication Neuro:no syncope, no lightheadedness Endo:no diabetes, no thyroid disease\  Wt Readings from Last 3 Encounters:  03/11/17 203 lb (92.1 kg)  02/21/17 206 lb 11.2 oz (93.8 kg)  02/10/17 221 lb 9.6 oz (100.5 kg)     PHYSICAL EXAM: VS:  BP (!) 106/54   Pulse 70   Resp 16   Ht 5\' 9"  (1.753 m)   Wt 203 lb (92.1 kg)   SpO2 96%   BMI 29.98 kg/m  , BMI Body mass index is 29.98 kg/m. General:Pleasant affect, NAD Skin:Warm and dry, brisk capillary refill HEENT:normocephalic, sclera clear, mucus membranes moist Neck:supple, no JVD, no bruits  Heart:S1S2 RRR without murmur, gallup, rub or click, chest wall incision is healing no drainage or redness Lungs:clear without rales, rhonchi, or wheezes ZOX:WRUE, non tender, + BS, do not palpate liver spleen or masses Ext:no lower ext edema, 2+ pedal pulses, 2+ radial pulses Neuro:alert and oriented, MAE, follows commands, + facial symmetry    EKG:  EKG is ordered today. The ekg ordered today demonstrates SR LVH with non specific ST changes that have continued post CABG.  No chest pain.    Recent Labs: 02/10/2017: TSH 4.770 02/21/2017: ALT 32; BUN 28; Creatinine, Ser 1.06; Hemoglobin 10.2; Magnesium 2.4; Platelets 287; Potassium 3.7; Sodium 135    Lipid Panel    Component Value Date/Time   CHOL 233 (H) 02/10/2017 1252   TRIG 209 (H) 02/10/2017 1252   HDL 38 (L) 02/10/2017 1252   CHOLHDL 6.1 (H) 02/10/2017 1252   LDLCALC 153 (H) 02/10/2017 1252       Other studies Reviewed: Additional studies/ records that were reviewed today include:  10.18.18  . Procedures   LEFT HEART CATH AND CORONARY ANGIOGRAPHY  Conclusion   Conclusions: 1. Severe three vessel coronary artery disease, as detailed below. 2. Moderately reduced left ventricular ejection fraction with global  hypokinesis. 3. Mildly elevated left ventricular filling pressure.  Recommendations: 1. Admit to 2 Heart. 2. IV NTG for BP control and anti-anginal therapy (though patient is currently chest pain free). 3. Restart heparin infusion 2 hours after TR band has been deflated. 4. Cardiac surgery consultation has been called (discussed with Dr. Donata Clay). 5. Statin therapy and metoprolol, as previously ordered. 6. Follow-up echo.     ECHO: 02/12/17  Study Conclusions  - Left ventricle: The cavity size was normal. There was moderate   concentric hypertrophy. Systolic function was moderately to   severely reduced. The estimated ejection fraction was in the  range of 30% to 35%. Diffuse hypokinesis. Doppler parameters are   consistent with abnormal left ventricular relaxation (grade 1   diastolic dysfunction). Doppler parameters are consistent with   indeterminate ventricular filling pressure. - Aortic valve: Transvalvular velocity was within the normal range.   There was no stenosis. There was no regurgitation. - Mitral valve: Transvalvular velocity was within the normal range.   There was no evidence for stenosis. There was mild regurgitation. - Left atrium: The atrium was mildly dilated. - Right ventricle: The cavity size was normal. Wall thickness was   normal. Systolic function was normal. - Atrial septum: No defect or patent foramen ovale was identified. - Tricuspid valve: There was trivial regurgitation. - Pulmonary arteries: Systolic pressure was within the normal   range. PA peak pressure: 22 mm Hg (S).  TEE 02/15/17  Study Conclusions  - Left ventricle: Systolic function was moderately reduced. The   estimated ejection fraction was in the range of 35% to 45%.   Diffuse global hypokinesis. - Staged echo: Limited Post- CPB exam: Significantly Improved LV   function. Vigorous, concentric LV function with no wall motion   abnormalities. EF 55-65%. RV function appears  normal as well. No   change post bypass in aortic valve function. No significant   change post bypass in mitral valve function.  Impressions:  - LV function improvement from pre-bypass images. No other   significant changes from pre-bypass images.   ASSESSMENT AND PLAN:  1.  MI with troponin of 10 --CAD with severe CAD  Follow up with Dr. Anne FuSkains in 6-8 weeks.on BB ASA and statin.  2.  S/p CABG x 4 utilizing LIMA to LAD, SVG to Diagonal, SVG to Ramus Intermediate, and SVG to RCA.  He also underwent endoscopic harvest of greater saphenous vein from his right leg.  Healing well and exercising by walking.  He will see Dr. Donata ClayVan Trigt in Dec.  3.  PAF post op brief, was on amiodarone for 7 days and has now stopped. occ skipped beats. SR today  4.  LV dysfuction with EF on TEE improved to 55-65%, per Dr. Judie PetitM. Croitoru in post op notes he would like to recheck echo in several weeks, will recheck in 4 week. On ACE, off lasix will check BMP  5.  HLD continue statin, and will need recheck when he is seen by Dr. Anne FuSkains   6.  Essential HTN on lower side currently but no lightheadedness or dizziness.  7.  LVH - improved BP.  8. Anemia will check CBC     Current medicines are reviewed with the patient today.  The patient Has no concerns regarding medicines.  The following changes have been made:  See above Labs/ tests ordered today include:see above  Disposition:   [name]:  see above  Signed, Nada BoozerLaura Ingold, NP  03/11/2017 12:01 PM    [facility] [address], [place], [place]  [address]/ [address] [place], [place] Phone: [phone]; Fax: [phone]  [phone]

## 2017-03-11 ENCOUNTER — Ambulatory Visit (INDEPENDENT_AMBULATORY_CARE_PROVIDER_SITE_OTHER): Payer: Self-pay | Admitting: Cardiology

## 2017-03-11 ENCOUNTER — Encounter: Payer: Self-pay | Admitting: Cardiology

## 2017-03-11 ENCOUNTER — Encounter (INDEPENDENT_AMBULATORY_CARE_PROVIDER_SITE_OTHER): Payer: Self-pay

## 2017-03-11 VITALS — BP 106/54 | HR 70 | Resp 16 | Ht 69.0 in | Wt 203.0 lb

## 2017-03-11 DIAGNOSIS — I519 Heart disease, unspecified: Secondary | ICD-10-CM

## 2017-03-11 DIAGNOSIS — I219 Acute myocardial infarction, unspecified: Secondary | ICD-10-CM

## 2017-03-11 DIAGNOSIS — I480 Paroxysmal atrial fibrillation: Secondary | ICD-10-CM

## 2017-03-11 DIAGNOSIS — I2510 Atherosclerotic heart disease of native coronary artery without angina pectoris: Secondary | ICD-10-CM

## 2017-03-11 DIAGNOSIS — E782 Mixed hyperlipidemia: Secondary | ICD-10-CM

## 2017-03-11 DIAGNOSIS — Z951 Presence of aortocoronary bypass graft: Secondary | ICD-10-CM

## 2017-03-11 DIAGNOSIS — E876 Hypokalemia: Secondary | ICD-10-CM

## 2017-03-11 DIAGNOSIS — I517 Cardiomegaly: Secondary | ICD-10-CM

## 2017-03-11 DIAGNOSIS — D649 Anemia, unspecified: Secondary | ICD-10-CM

## 2017-03-11 MED ORDER — DOCUSATE SODIUM 100 MG PO CAPS: 100.0000 mg | ORAL_CAPSULE | Freq: Every day | ORAL | 1 refills | Status: DC | PRN

## 2017-03-11 NOTE — Patient Instructions (Addendum)
Medication Instructions:   START TAKING COLACE 100 MG AS NEEDED FOR CONSTIPATION   If you need a refill on your cardiac medications before your next appointment, please call your pharmacy.  Labwork: CBC AND BMET    Testing/Procedures:  IN 4 WEEKS Your physician has requested that you have an echocardiogram. Echocardiography is a painless test that uses sound waves to create images of your heart. It provides your doctor with information about the size and shape of your heart and how well your heart's chambers and valves are working. This procedure takes approximately one hour. There are no restrictions for this procedure.      Follow-Up: WITH DR Anne Fu IN 6 TO 8 WEEKS    Any Other Special Instructions Will Be Listed Below (If Applicable).

## 2017-03-12 LAB — BASIC METABOLIC PANEL
BUN / CREAT RATIO: 13 (ref 9–20)
BUN: 12 mg/dL (ref 6–20)
CO2: 25 mmol/L (ref 20–29)
CREATININE: 0.96 mg/dL (ref 0.76–1.27)
Calcium: 9.6 mg/dL (ref 8.7–10.2)
Chloride: 99 mmol/L (ref 96–106)
GFR calc Af Amer: 116 mL/min/{1.73_m2} (ref >59)
GFR calc non Af Amer: 101 mL/min/{1.73_m2} (ref >59)
GLUCOSE: 88 mg/dL (ref 65–99)
POTASSIUM: 5.1 mmol/L (ref 3.5–5.2)
SODIUM: 140 mmol/L (ref 134–144)

## 2017-03-12 LAB — CBC
HEMOGLOBIN: 10.6 g/dL — AB (ref 13.0–17.7)
Hematocrit: 33.6 % — ABNORMAL LOW (ref 37.5–51.0)
MCH: 25.3 pg — ABNORMAL LOW (ref 26.6–33.0)
MCHC: 31.5 g/dL (ref 31.5–35.7)
MCV: 80 fL (ref 79–97)
PLATELETS: 313 10*3/uL (ref 150–379)
RBC: 4.19 x10E6/uL (ref 4.14–5.80)
RDW: 17.2 % — ABNORMAL HIGH (ref 12.3–15.4)
WBC: 5.3 10*3/uL (ref 3.4–10.8)

## 2017-03-21 MED FILL — ATORVASTATIN 80 MG TABLET: 80 | 30 days supply | Qty: 30 | Fill #1

## 2017-03-21 MED FILL — ?METOPROLOL 25 MG TABLET: 25 | 30 days supply | Qty: 60 | Fill #1

## 2017-03-21 MED FILL — LISINOPRIL 10 MG TABS: 10 | 30 days supply | Qty: 30 | Fill #1

## 2017-03-22 ENCOUNTER — Ambulatory Visit (HOSPITAL_COMMUNITY): Payer: Self-pay | Attending: Cardiology

## 2017-03-22 ENCOUNTER — Other Ambulatory Visit: Payer: Self-pay

## 2017-03-22 DIAGNOSIS — R29898 Other symptoms and signs involving the musculoskeletal system: Secondary | ICD-10-CM | POA: Insufficient documentation

## 2017-03-22 DIAGNOSIS — I119 Hypertensive heart disease without heart failure: Secondary | ICD-10-CM | POA: Insufficient documentation

## 2017-03-22 DIAGNOSIS — I2510 Atherosclerotic heart disease of native coronary artery without angina pectoris: Secondary | ICD-10-CM

## 2017-03-22 DIAGNOSIS — I214 Non-ST elevation (NSTEMI) myocardial infarction: Secondary | ICD-10-CM | POA: Insufficient documentation

## 2017-03-22 MED ORDER — PERFLUTREN LIPID MICROSPHERE
1.0000 mL | INTRAVENOUS | Status: AC | PRN
  Administered 2017-03-22: 3 mL via INTRAVENOUS

## 2017-03-23 ENCOUNTER — Ambulatory Visit: Payer: Self-pay | Admitting: Cardiothoracic Surgery

## 2017-03-25 ENCOUNTER — Other Ambulatory Visit: Payer: Self-pay | Admitting: Cardiothoracic Surgery

## 2017-03-25 DIAGNOSIS — Z951 Presence of aortocoronary bypass graft: Secondary | ICD-10-CM

## 2017-03-28 ENCOUNTER — Ambulatory Visit
Admission: RE | Admit: 2017-03-28 | Discharge: 2017-03-28 | Disposition: A | Discharge status: Home / Self Care | Payer: Self-pay | Source: Ambulatory Visit | Attending: Cardiothoracic Surgery | Admitting: Cardiothoracic Surgery

## 2017-03-28 ENCOUNTER — Ambulatory Visit (INDEPENDENT_AMBULATORY_CARE_PROVIDER_SITE_OTHER): Payer: Self-pay | Admitting: Physician Assistant

## 2017-03-28 ENCOUNTER — Other Ambulatory Visit: Payer: Self-pay

## 2017-03-28 VITALS — BP 129/83 | HR 77 | Resp 18 | Ht 68.0 in | Wt 193.0 lb

## 2017-03-28 DIAGNOSIS — Z951 Presence of aortocoronary bypass graft: Secondary | ICD-10-CM

## 2017-03-28 NOTE — Progress Notes (Signed)
HPI:  Patient returns for routine postoperative follow-up having undergone CABG x 4 on 02/15/2017. The patient's early postoperative recovery while in the hospital was unremarkable.  Patient does not speak [place].  Translation was provided by Interpreter services.  Since hospital discharge the patient reports he is doing well.  He is ambulating in 30 min increments 2-3 times per day.  He isn't sleeping well due to not being able to lay on his side.  He has been taking up to 4 325 mg ASA daily.  He was instructed to only take 1 ASA per day as this will not help with fatigue.  He understands this.  They had multiple dietary questions all of which were addressed.  His appetite has not yet returned to normal but is getting better.  He states it is difficult without the use of salt.  I recommended the try adding Mrs. Dash to their food which may help with flavor.  His wounds are healing without evidence of infection.   Current Outpatient Medications  Medication Sig Dispense Refill  . aspirin EC 325 MG EC tablet Take 1 tablet (325 mg total) by mouth daily. 30 tablet 0  . atorvastatin (LIPITOR) 80 MG tablet Take 1 tablet (80 mg total) by mouth daily at 6 PM. 30 tablet 3  . clotrimazole (LOTRIMIN) 1 % cream Apply 1 application topically 2 (two) times daily. 30 g 0  . docusate sodium (COLACE) 100 MG capsule Take 1 capsule (100 mg total) daily as needed by mouth for mild constipation. 60 capsule 1  . furosemide (LASIX) 40 MG tablet Take 1 tablet (40 mg total) by mouth daily. 7 tablet 0  . lisinopril (PRINIVIL,ZESTRIL) 10 MG tablet Take 1 tablet (10 mg total) by mouth daily. 30 tablet 3  . metoprolol tartrate (LOPRESSOR) 25 MG tablet Take 1 tablet (25 mg total) by mouth 2 (two) times daily. 60 tablet 3  . omeprazole (PRILOSEC) 20 MG capsule Take 1 capsule (20 mg total) by mouth 2 (two) times daily before a meal. 60 capsule 2  . oxyCODONE (OXY IR/ROXICODONE) 5 MG immediate release tablet Take 1-2 tablets (5-10  mg total) by mouth every 4 (four) hours as needed for severe pain. 30 tablet 0   No current facility-administered medications for this visit.     Physical Exam:  BP 129/83   Pulse 77   Resp 18   Ht 5\' 8"  (1.727 m)   Wt 193 lb (87.5 kg)   SpO2 98% Comment: on RA  BMI 29.35 kg/m   Gen: no apparent distress Heart: RRR Lungs: CTA bilaterally Abd: soft non-tender, non-distended Ext: no edema Incisions: well healed  Diagnostic Tests:  CXR: no pleural effusion, resolution of previous apical pneumothorax  A/P:  1. S/P CABG- doing very well 2. Activity- may resume driving, increase activity as tolerated with weight restrictions to 10-15 lbs 3. Dietary concerns- refer to nutritionist who can likely develop them a dietary plan and educate patient and wife on importance of good diet 4. RTC prn  Lowella Dandy, PA-C [facility] [phone]

## 2017-04-11 ENCOUNTER — Ambulatory Visit: Payer: Self-pay | Attending: Nurse Practitioner | Admitting: Nurse Practitioner

## 2017-04-11 ENCOUNTER — Other Ambulatory Visit: Payer: Self-pay | Admitting: *Deleted

## 2017-04-11 ENCOUNTER — Encounter: Payer: Self-pay | Admitting: Nurse Practitioner

## 2017-04-11 VITALS — BP 133/84 | HR 75 | Temp 98.4°F | Ht 68.0 in | Wt 202.0 lb

## 2017-04-11 DIAGNOSIS — K6289 Other specified diseases of anus and rectum: Secondary | ICD-10-CM | POA: Insufficient documentation

## 2017-04-11 DIAGNOSIS — Z79899 Other long term (current) drug therapy: Secondary | ICD-10-CM | POA: Insufficient documentation

## 2017-04-11 DIAGNOSIS — R208 Other disturbances of skin sensation: Secondary | ICD-10-CM

## 2017-04-11 DIAGNOSIS — Z951 Presence of aortocoronary bypass graft: Secondary | ICD-10-CM

## 2017-04-11 DIAGNOSIS — Z7982 Long term (current) use of aspirin: Secondary | ICD-10-CM | POA: Insufficient documentation

## 2017-04-11 DIAGNOSIS — I252 Old myocardial infarction: Secondary | ICD-10-CM | POA: Insufficient documentation

## 2017-04-11 DIAGNOSIS — I10 Essential (primary) hypertension: Secondary | ICD-10-CM | POA: Insufficient documentation

## 2017-04-11 DIAGNOSIS — Z833 Family history of diabetes mellitus: Secondary | ICD-10-CM | POA: Insufficient documentation

## 2017-04-11 MED ORDER — PRAMOXINE HCL 1 % RE FOAM: 1.0000 "application " | Freq: Three times a day (TID) | RECTAL | 1 refills | Status: DC | PRN

## 2017-04-11 MED ORDER — HYDROCORTISONE ACETATE 25 MG RE SUPP: 25.0000 mg | Freq: Two times a day (BID) | RECTAL | 1 refills | Status: DC

## 2017-04-11 MED ORDER — PRAMOXINE HCL 1 % RE FOAM: 1.0000 "application " | Freq: Three times a day (TID) | RECTAL | 0 refills | Status: DC | PRN

## 2017-04-11 MED ORDER — HYDROCORTISONE ACE-PRAMOXINE 1-1 % RE FOAM: 1.0000 | Freq: Three times a day (TID) | RECTAL | 1 refills | Status: DC

## 2017-04-11 NOTE — Progress Notes (Signed)
Assessment & Plan:  [name] [name] was seen today for establish care.  Diagnoses and all orders for this visit:  Rectal burning -     hydrocortisone-pramoxine (PROCTOFOAM-HC) rectal foam; Place 1 applicator rectally 3 (three) times daily.  Essential hypertension, benign Continue all antihypertensives as prescribed.  Remember to bring in your blood pressure log with you for your follow up appointment.  DASH/Mediterranean Diets are healthier choices for HTN.    S/P CABG x 4 Cardiology appointment 05-06-2017    Patient has been counseled on age-appropriate routine health concerns for screening and prevention. These are reviewed and up-to-date. Referrals have been placed accordingly. Immunizations are up-to-date or declined.    Subjective:   Chief Complaint  Patient presents with  . Establish Care    Patient is here to establish for hypertension. Patient stated that he is having pain when he having a bowl movement after his heart surgery.    HPI  VRI was used to communicate directly with patient for the entire encounter including providing detailed patient instructions.  [name] [name] 36 y.o. male presents to office today to establish care as a new patient. He is s/p NSTEMI with CAD and CABG x4 (01-2017). He has been doing fairly well post procedure and has been seen by cardiology with next follow up scheduled in January. He denies any chest pain, epigastric pain, shortness of breath, palpitations or bilateral lower extremity edema.     Hemorrhoids Today he has complaints of rectal burning and itching after defecation. He complained of blood in his stools at his last follow up appointment 03-11-2017 with cardiology and was prescribed stool softeners at that time. He endorses compliance with taking the stool softeners and denies constipation today however he states he has been experiencing a pain in his rectum with onset one month post surgery. He has never had hemorrhoids before  so he is unable to tell me if what he is experiencing feels like past hemorrhoids.  He describes symptoms as anorectal itching. Treatment to date has been none. Patient denies family hx of colorectal CA, history of previous STDs, known or suspected STD exposure, maroon colored stools, melena, receptive anal intercourse and weight loss.   Review of Systems  Constitutional: Negative.  Negative for chills, diaphoresis, fever, malaise/fatigue and weight loss.  Respiratory: Negative.  Negative for cough, sputum production and wheezing.   Cardiovascular: Negative.  Negative for chest pain, orthopnea, claudication and leg swelling.  Gastrointestinal: Negative for abdominal pain, blood in stool, melena, nausea and vomiting.  Genitourinary: Negative.   Neurological: Negative.  Negative for dizziness, tingling, focal weakness, loss of consciousness, weakness and headaches.  Psychiatric/Behavioral: Negative.     Past Medical History:  Diagnosis Date  . Essential hypertension, benign     Past Surgical History:  Procedure Laterality Date  . CORONARY ARTERY BYPASS GRAFT N/A 02/15/2017   Procedure: CORONARY ARTERY BYPASS GRAFTING (CABG), ON PUMP, TIMES FOUR, USING LEFT INTERNAL MAMMARY ARTERY AND ENDOSCOPICALLY HARVESTED RIGHT GREATER SAPHENOUS VEIN;  Surgeon: Kerin Perna, MD;  Location: [facility] OR;  Service: Open Heart Surgery;  Laterality: N/A;  LIMA to LAD, SVG to DIAGONAL, SVG to RAMUS INTERMEDIATE, SVG to RCA  . LEFT HEART CATH AND CORONARY ANGIOGRAPHY N/A 02/10/2017   Procedure: LEFT HEART CATH AND CORONARY ANGIOGRAPHY;  Surgeon: Yvonne Kendall, MD;  Location: [facility] INVASIVE CV LAB;  Service: Cardiovascular;  Laterality: N/A;  . TEE WITHOUT CARDIOVERSION N/A 02/15/2017   Procedure: TRANSESOPHAGEAL ECHOCARDIOGRAM (TEE);  Surgeon: Donata Clay, Theron Arista, MD;  Location: [facility] OR;  Service: Open Heart Surgery;  Laterality: N/A;    Family History  Problem Relation Age of Onset  . Diabetes Sister   . Diabetes  Brother     Social History Reviewed with no changes to be made today.   Outpatient Medications Prior to Visit  Medication Sig Dispense Refill  . aspirin EC 325 MG EC tablet Take 1 tablet (325 mg total) by mouth daily. 30 tablet 0  . atorvastatin (LIPITOR) 80 MG tablet Take 1 tablet (80 mg total) by mouth daily at 6 PM. 30 tablet 3  . clotrimazole (LOTRIMIN) 1 % cream Apply 1 application topically 2 (two) times daily. 30 g 0  . docusate sodium (COLACE) 100 MG capsule Take 1 capsule (100 mg total) daily as needed by mouth for mild constipation. 60 capsule 1  . lisinopril (PRINIVIL,ZESTRIL) 10 MG tablet Take 1 tablet (10 mg total) by mouth daily. 30 tablet 3  . metoprolol tartrate (LOPRESSOR) 25 MG tablet Take 1 tablet (25 mg total) by mouth 2 (two) times daily. 60 tablet 3  . omeprazole (PRILOSEC) 20 MG capsule Take 1 capsule (20 mg total) by mouth 2 (two) times daily before a meal. 60 capsule 2   No facility-administered medications prior to visit.     No Known Allergies     Objective:    BP 133/84 (BP Location: Right Arm, Patient Position: Sitting, Cuff Size: Normal)   Pulse 75   Temp 98.4 F (36.9 C) (Oral)   Ht 5\' 8"  (1.727 m)   Wt 202 lb (91.6 kg)   SpO2 99%   BMI 30.71 kg/m  Wt Readings from Last 3 Encounters:  04/11/17 202 lb (91.6 kg)  03/28/17 193 lb (87.5 kg)  03/11/17 203 lb (92.1 kg)    Physical Exam  Constitutional: He is oriented to person, place, and time. He appears well-developed and well-nourished. He is cooperative.  HENT:  Head: Normocephalic and atraumatic.  Cardiovascular: Normal rate, regular rhythm, normal heart sounds and intact distal pulses. Exam reveals no gallop and no friction rub.  No murmur heard. Pulmonary/Chest: Effort normal and breath sounds normal. No tachypnea. No respiratory distress. He has no decreased breath sounds. He has no wheezes. He has no rhonchi. He has no rales. He exhibits no tenderness.  Abdominal: Soft. Bowel sounds are  normal. He exhibits no distension and no mass. There is no tenderness. There is no rebound and no guarding.  Genitourinary: Rectum normal. Rectal exam shows no external hemorrhoid, no mass, no tenderness and anal tone normal.  Musculoskeletal: Normal range of motion. He exhibits no edema.  Neurological: He is alert and oriented to person, place, and time. Coordination normal.  Skin: Skin is warm and dry.  Psychiatric: He has a normal mood and affect. His behavior is normal. Judgment and thought content normal.  Nursing note and vitals reviewed.     Patient has been counseled extensively about nutrition and exercise as well as the importance of adherence with medications and regular follow-up. The patient was given clear instructions to go to ER or return to medical center if symptoms don't improve, worsen or new problems develop. The patient verbalized understanding.   Follow-up: Return in about 3 weeks (around 05/02/2017) for f.u .   Claiborne RiggZelda W Ashten Sarnowski, FNP-BC [facility] [place], [place] [phone]   04/12/2017, 11:29 AM

## 2017-04-11 NOTE — Patient Instructions (Addendum)
Plan de alimentacin DASH (DASH Eating Plan) DASH es la sigla en ingls de "Enfoques Alimentarios para Detener la Hipertensin". El plan de alimentacin DASH ha demostrado bajar la presin arterial elevada (hipertensin). Los beneficios adicionales para la salud pueden incluir la disminucin del riesgo de diabetes mellitus tipo2, enfermedades cardacas e ictus. Este plan tambin puede ayudar a adelgazar. QU DEBO SABER ACERCA DEL PLAN DE ALIMENTACIN DASH? Para el plan de alimentacin DASH, seguir las siguientes pautas generales:  Elija los alimentos que contienen menos de 150 miligramos de sodio por porcin (segn se indica en la etiqueta de los alimentos).  Use hierbas o aderezos sin sal, en lugar de sal de mesa o sal marina.  Consulte al mdico o farmacutico antes de usar sustitutos de la sal.  Consuma los productos con menor contenido de sodio. Estos productos suelen estar etiquetados como "bajo en sodio" o "sin agregado de sal".  Coma alimentos frescos. No consuma una gran cantidad de alimentos enlatados.  Coma ms verduras, frutas y productos lcteos con bajo contenido de grasas.  Elija los cereales integrales. Busque la palabra "integral" en el primer lugar de la lista de ingredientes.  Elija el pescado y el pollo o el pavo sin piel ms a menudo que las carnes rojas. Limite el consumo de pescado, carne de ave y carne a 6onzas (170g) por da.  Limite el consumo de dulces, postres, azcares y bebidas azucaradas.  Elija las grasas saludables para el corazn.  Consuma ms comida casera y menos de restaurante, de buf y comida rpida.  Limite el consumo de alimentos fritos.  No fra los alimentos. A la hora de cocinarlos, opte por hornearlos, hervirlos, grillarlos y asarlos a la parrilla.  Cuando coma en un restaurante, pida que preparen su comida con menos sal o, en lo posible, sin nada de sal. QU ALIMENTOS PUEDO COMER? Pida ayuda a un nutricionista para conocer las  necesidades calricas individuales. Cereales Pan de salvado o integral. Arroz integral. Pastas de salvado o integrales. Quinua, trigo burgol y cereales integrales. Cereales con bajo contenido de sodio. Tortillas de harina de maz o de salvado. Pan de maz integral. Galletas saladas integrales. Galletas con bajo contenido de sodio. Vegetales Verduras frescas o congeladas (crudas, al vapor, asadas o grilladas). Jugos de tomate y verduras con contenido bajo o reducido de sodio. Pasta y salsa de tomate con contenido bajo o reducido de sodio. Verduras enlatadas con bajo contenido de sodio o reducido de sodio. Frutas Frutas frescas, en conserva (en su jugo natural) o frutas congeladas. Carnes y otros productos con protenas Carne de res molida (al 85% o ms magra), carne de res de animales alimentados con pastos o carne de res sin la grasa. Pollo o pavo sin piel. Carne de pollo o de pavo molida. Cerdo sin la grasa. Todos los pescados y frutos de mar. Huevos. Porotos, guisantes o lentejas secos. Frutos secos y semillas sin sal. Frijoles enlatados sin sal. Lcteos Productos lcteos con bajo contenido de grasas, como leche descremada o al 1%, quesos reducidos en grasas o al 2%, ricota con bajo contenido de grasas o queso cottage, o yogur natural con bajo contenido de grasas. Quesos con contenido bajo o reducido de sodio. Grasas y aceites Margarinas en barra que no contengan grasas trans. Mayonesa y alios para ensaladas livianos o reducidos en grasas (reducidos en sodio). [place]. Aceites de crtamo, oliva o canola. Mantequilla natural de man o almendra. Otros Palomitas de maz y pretzels sin sal. Los artculos mencionados arriba pueden no   ser una lista completa de las bebidas o los alimentos recomendados. Comunquese con el nutricionista para conocer ms opciones. QU ALIMENTOS NO SE RECOMIENDAN? Cereales Pan blanco. Pastas blancas. Arroz blanco. Pan de maz refinado. Bagels y croissants. Galletas  saladas que contengan grasas trans. Vegetales Vegetales con crema o fritos. Verduras en salsa de queso. Verduras enlatadas comunes. Pasta y salsa de tomate en lata comunes. Jugos comunes de tomate y de verduras. Frutas Fruta enlatada en almbar liviano o espeso. Jugo de frutas. Carnes y otros productos con protenas Cortes de carne con grasa. Costillas, alas de pollo, tocineta, salchicha, mortadela, salame, chinchulines, tocino, perros calientes, salchichas alemanas y embutidos envasados. Frutos secos y semillas con sal. Frijoles con sal en lata. Lcteos Leche entera o al 2%, crema, mezcla de leche y crema, y queso crema. Yogur entero o endulzado. Quesos o queso azul con alto contenido de grasas. Cremas no lcteas y coberturas batidas. Quesos procesados, quesos para untar o cuajadas. Condimentos Sal de cebolla y ajo, sal condimentada, sal de mesa y sal marina. Salsas en lata y envasadas. Salsa Worcestershire. Salsa trtara. Salsa barbacoa. Salsa teriyaki. Salsa de soja, incluso la que tiene contenido reducido de sodio. Salsa de carne. Salsa de pescado. Salsa de ostras. Salsa rosada. Rbano picante. Ketchup y mostaza. Saborizantes y tiernizantes para carne. Caldo en cubitos. Salsa picante. Salsa tabasco. Adobos. Aderezos para tacos. Salsas. Grasas y aceites Mantequilla, margarina en barra, manteca de cerdo, grasa, mantequilla clarificada y grasa de tocino. Aceites de coco, de palmiste o de palma. Aderezos comunes para ensalada. Otros Pickles y aceitunas. Palomitas de maz y pretzels con sal. Los artculos mencionados arriba pueden no ser una lista completa de las bebidas y los alimentos que se deben evitar. Comunquese con el nutricionista para obtener ms informacin. DNDE PUEDO ENCONTRAR MS INFORMACIN? Instituto Nacional del Corazn, del Pulmn y de la Sangre (National Heart, Lung, and Blood Institute): www.nhlbi.nih.gov/health/health-topics/topics/dash/ Esta informacin no tiene como fin  reemplazar el consejo del mdico. Asegrese de hacerle al mdico cualquier pregunta que tenga. Document Released: 04/01/2011 Document Revised: 08/04/2015 Document Reviewed: 02/14/2013 Elsevier Interactive Patient Education  2017 Elsevier Inc. Hipertensin Hypertension La hipertensin, conocida comnmente como presin arterial alta, se produce cuando la sangre bombea en las arterias con mucha fuerza. Las arterias son los vasos sanguneos que transportan la sangre desde el corazn al resto del cuerpo. La hipertensin hace que el corazn haga ms esfuerzo para bombear sangre y puede provocar que las arterias se estrechen o endurezcan. La hipertensin no tratada o no controlada puede causar infarto de miocardio, accidentes cerebrovasculares, enfermedad renal y otros problemas. Una lectura de la presin arterial consiste de un nmero ms alto sobre un nmero ms bajo. En condiciones ideales, la presin arterial debe estar por debajo de 120/80. El primer nmero ("superior") es la presin sistlica. Es la medida de la presin de las arterias cuando el corazn late. El segundo nmero ("inferior") es la presin diastlica. Es la medida de la presin en las arterias cuando el corazn se relaja. Cules son las causas? Se desconoce la causa de esta afeccin. Qu incrementa el riesgo? Algunos factores de riesgo de hipertensin estn bajo su control. Otros no. Factores que puede modificar  Fumar.  Tener diabetes mellitus tipo 2, colesterol alto, o ambos.  No hacer la cantidad suficiente de actividad fsica o ejercicio.  Tener sobrepeso.  Consumir mucha grasa, azcar, caloras o sal (sodio) en su dieta.  Beber alcohol en exceso. Factores que son difciles o imposibles de modificar  Tener   enfermedad renal crnica.  Tener antecedentes familiares de presin arterial alta.  La edad. Los riesgos aumentan con la edad.  La raza. El riesgo es mayor para las personas afroamericanas.  El sexo. Antes de  los 45aos, los hombres corren ms riesgo que las mujeres. Despus de los 65aos, las mujeres corren ms riesgo que los hombres.  Tener apnea obstructiva del sueo.  El estrs. Cules son los signos o los sntomas? La presin arterial extremadamente alta (crisis hipertensiva) puede provocar:  Dolor de cabeza.  Ansiedad.  Falta de aire.  Hemorragia nasal.  Nuseas y vmitos.  Dolor de pecho intenso.  Una crisis de movimientos que no puede controlar (convulsiones).  Cmo se diagnostica? Esta afeccin se diagnostica midiendo su presin arterial mientras se encuentra sentado, con el brazo apoyado sobre una superficie. El brazalete del tensimetro debe colocarse directamente sobre la piel de la parte superior del brazo y al nivel de su corazn. Debe medirla al menos dos veces en el mismo brazo. Determinadas condiciones pueden causar una diferencia de presin arterial entre el brazo izquierdo y el derecho. Ciertos factores pueden provocar que las lecturas de la presin arterial sean inferiores o superiores a lo normal (elevadas) por un perodo corto de tiempo:  Si su presin arterial es ms alta cuando se encuentra en el consultorio del mdico que cuando la mide en su hogar, se denomina "hipertensin de bata blanca". La mayora de las personas que tienen esta afeccin no deben ser medicadas.  Si su presin arterial es ms alta en el hogar que cuando se encuentra en el consultorio del mdico, se denomina "hipertensin enmascarada". La mayora de las personas que tienen esta afeccin deben ser medicadas para controlar la presin arterial.  Si tiene una lecturas de presin arterial alta durante una visita o si tiene presin arterial normal con otros factores de riesgo:  Es posible que se le pida que regrese otro da para volver a controlar su presin arterial.  Se le puede pedir que se controle la presin arterial en su casa durante 1 semana o ms.  Si se le diagnostica hipertensin,  es posible que se le realicen otros anlisis de sangre o estudios de diagnstico por imgenes para ayudar a su mdico a comprender su riesgo general de tener otras afecciones. Cmo se trata? Esta afeccin se trata haciendo cambios saludables en el estilo de vida, tales como ingerir alimentos saludables, realizar ms ejercicio y reducir el consumo de alcohol. El mdico puede recetarle medicamentos si los cambios en el estilo de vida no son suficientes para lograr controlar la presin arterial y si:  Su presin arterial sistlica est por encima de 130.  Su presin arterial diastlica est por encima de 80.  La presin arterial deseada puede variar en funcin de las enfermedades, la edad y otros factores personales. Siga estas instrucciones en su casa: Comida y bebida  Siga una dieta con alto contenido de fibras y potasio, y con bajo contenido de sodio, azcar agregada y grasas. Un ejemplo de plan alimenticio es la dieta DASH (Dietary Approaches to Stop Hypertension, Mtodos alimenticios para detener la hipertensin). Para alimentarse de esta manera: ? Coma mucha fruta y verdura fresca. Trate de que la mitad del plato de cada comida sea de frutas y verduras. ? Coma cereales integrales, como pasta integral, arroz integral y pan integral. Llene aproximadamente un cuarto del plato con cereales integrales. ? Coma y beba productos lcteos con bajo contenido de grasa, como leche descremada o yogur bajo en grasas. ?   Evite la ingesta de cortes de carne grasa, carne procesada o curada, y carne de ave con piel. Llene aproximadamente un cuarto del plato con protenas magras, como pescado, pollo sin piel, frijoles, huevos y tofu. ? Evite ingerir alimentos prehechos o procesados. En general, estos tienen mayor cantidad de sodio, azcar agregada y grasa.  Reduzca su ingesta diaria de sodio. La mayora de las personas que tienen hipertensin deben comer menos de 1500 mg de sodio por da.  Limite el consumo de  alcohol a no ms de 1 medida por da si es mujer y no est embarazada y a 2 medidas por da si es hombre. Una medida equivale a 12onzas de cerveza, 5onzas de vino o 1onzas de bebidas alcohlicas de alta graduacin. Estilo de vida  Trabaje con su mdico para mantener un peso saludable o perder peso. Pregntele cual es su peso recomendado.  Realice al menos 30 minutos de ejercicio que haga que se acelere su corazn (ejercicio aerbico) la mayora de los das de la semana. Estas actividades pueden incluir caminar, nadar o andar en bicicleta.  Incluya ejercicios para fortalecer sus msculos (ejercicios de resistencia), como pilates o levantamiento de pesas, como parte de su rutina semanal de ejercicios. Intente realizar 30minutos de este tipo de ejercicios al menos tres das a la semana.  No consuma ningn producto que contenga nicotina o tabaco, como cigarrillos y cigarrillos electrnicos. Si necesita ayuda para dejar de fumar, consulte al mdico.  Contrlese la presin arterial en su casa segn las indicaciones del mdico.  Concurra a todas las visitas de control como se lo haya indicado el mdico. Esto es importante. Medicamentos  Tome los medicamentos de venta libre y los recetados solamente como se lo haya indicado el mdico. Siga cuidadosamente las indicaciones. Los medicamentos para la presin arterial deben tomarse segn las indicaciones.  No omita las dosis de medicamentos para la presin arterial. Si lo hace, estar en riesgo de tener problemas y puede hacer que los medicamentos sean menos eficaces.  Pregntele a su mdico a qu efectos secundarios o reacciones a los medicamentos debe prestar atencin. Comunquese con un mdico si:  Piensa que tiene una reaccin a un medicamento que est tomando.  Tiene dolores de cabeza frecuentes (recurrentes).  Siente mareos.  Tiene hinchazn en los tobillos.  Tiene problemas de visin. Solicite ayuda de inmediato si:  Siente un dolor  de cabeza intenso o confusin.  Siente debilidad inusual o adormecimiento.  Siente que va a desmayarse.  Siente un dolor intenso en el pecho o el abdomen.  Vomita repetidas veces.  Tiene dificultad para respirar. Resumen  La hipertensin se produce cuando la sangre bombea en las arterias con mucha fuerza. Si esta afeccin no se controla, podra correr riesgo de tener complicaciones graves.  La presin arterial deseada puede variar en funcin de las enfermedades, la edad y otros factores personales. Para la mayora de las personas, una presin arterial normal es menor que 120/80.  La hipertensin se trata con cambios en el estilo de vida, medicamentos o una combinacin de ambos. Los cambios en el estilo de vida incluyen prdida de peso, ingerir alimentos sanos, seguir una dieta baja en sodio, hacer ms ejercicio y limitar el consumo de alcohol. Esta informacin no tiene como fin reemplazar el consejo del mdico. Asegrese de hacerle al mdico cualquier pregunta que tenga. Document Released: 04/12/2005 Document Revised: 03/24/2016 Document Reviewed: 03/24/2016 Elsevier Interactive Patient Education  2018 Elsevier Inc.  

## 2017-04-20 MED FILL — ATORVASTATIN 80 MG TABLET: 80 | 30 days supply | Qty: 30 | Fill #2

## 2017-04-20 MED FILL — LISINOPRIL 10 MG TABS: 10 | 30 days supply | Qty: 30 | Fill #2

## 2017-04-20 MED FILL — ?METOPROLOL 25 MG TABLET: 25 | 30 days supply | Qty: 60 | Fill #2

## 2017-05-02 ENCOUNTER — Ambulatory Visit: Payer: Self-pay | Admitting: Nurse Practitioner

## 2017-05-03 ENCOUNTER — Ambulatory Visit: Payer: Self-pay | Admitting: Dietician

## 2017-05-06 ENCOUNTER — Ambulatory Visit (INDEPENDENT_AMBULATORY_CARE_PROVIDER_SITE_OTHER): Payer: Self-pay | Admitting: Cardiology

## 2017-05-06 ENCOUNTER — Encounter: Payer: Self-pay | Admitting: Cardiology

## 2017-05-06 VITALS — BP 130/80 | HR 96 | Ht 68.0 in | Wt 205.8 lb

## 2017-05-06 DIAGNOSIS — I480 Paroxysmal atrial fibrillation: Secondary | ICD-10-CM

## 2017-05-06 DIAGNOSIS — I2510 Atherosclerotic heart disease of native coronary artery without angina pectoris: Secondary | ICD-10-CM

## 2017-05-06 DIAGNOSIS — Z951 Presence of aortocoronary bypass graft: Secondary | ICD-10-CM

## 2017-05-06 DIAGNOSIS — E785 Hyperlipidemia, unspecified: Secondary | ICD-10-CM

## 2017-05-06 LAB — LIPID PANEL
CHOLESTEROL TOTAL: 117 mg/dL (ref 100–199)
Chol/HDL Ratio: 2.9 ratio (ref 0.0–5.0)
HDL: 40 mg/dL (ref >39)
LDL Calculated: 59 mg/dL (ref 0–99)
Triglycerides: 91 mg/dL (ref 0–149)
VLDL Cholesterol Cal: 18 mg/dL (ref 5–40)

## 2017-05-06 MED ORDER — METOPROLOL SUCCINATE ER 100 MG PO TB24: 100.0000 mg | ORAL_TABLET | Freq: Every day | ORAL | 3 refills | Status: DC

## 2017-05-06 NOTE — Progress Notes (Signed)
Cardiology Office Note:    Date:  05/06/2017   ID:  [name] [name], DOB [birth year], MRN [id number]  PCP:  Claiborne Rigg, NP  Cardiologist:  Donato Schultz, MD   Referring MD: Myles Lipps, MD     History of Present Illness:    [name] [name] is a 37 y.o. male with severe coronary artery disease status post CABG x4 on 02/15/17, Dr. Maren Beach here for follow-up.  Has ischemic cardiomyopathy.  EF 40% last checked in November 2018.  Overall he is doing well, no chest pain, no syncope, no bleeding, no orthopnea, no PND.  Past Medical History:  Diagnosis Date  . Essential hypertension, benign     Past Surgical History:  Procedure Laterality Date  . CORONARY ARTERY BYPASS GRAFT N/A 02/15/2017   Procedure: CORONARY ARTERY BYPASS GRAFTING (CABG), ON PUMP, TIMES FOUR, USING LEFT INTERNAL MAMMARY ARTERY AND ENDOSCOPICALLY HARVESTED RIGHT GREATER SAPHENOUS VEIN;  Surgeon: Kerin Perna, MD;  Location: [facility] OR;  Service: Open Heart Surgery;  Laterality: N/A;  LIMA to LAD, SVG to DIAGONAL, SVG to RAMUS INTERMEDIATE, SVG to RCA  . LEFT HEART CATH AND CORONARY ANGIOGRAPHY N/A 02/10/2017   Procedure: LEFT HEART CATH AND CORONARY ANGIOGRAPHY;  Surgeon: Yvonne Kendall, MD;  [facility];  Service: Cardiovascular;  Laterality: N/A;  . TEE WITHOUT CARDIOVERSION N/A 02/15/2017   Procedure: TRANSESOPHAGEAL ECHOCARDIOGRAM (TEE);  Surgeon: Donata Clay, Theron Arista, MD;  Location: [facility] OR;  Service: Open Heart Surgery;  Laterality: N/A;    Current Medications: Current Meds  Medication Sig  . aspirin EC 325 MG EC tablet Take 1 tablet (325 mg total) by mouth daily.  Marland Kitchen atorvastatin (LIPITOR) 80 MG tablet Take 1 tablet (80 mg total) by mouth daily at 6 PM.  . clotrimazole (LOTRIMIN) 1 % cream Apply 1 application topically 2 (two) times daily.  Marland Kitchen docusate sodium (COLACE) 100 MG capsule Take 1 capsule (100 mg total) daily as needed by mouth for mild constipation.  .  hydrocortisone-pramoxine (PROCTOFOAM-HC) rectal foam Place 1 applicator rectally 3 (three) times daily.  Marland Kitchen lisinopril (PRINIVIL,ZESTRIL) 10 MG tablet Take 1 tablet (10 mg total) by mouth daily.  Marland Kitchen omeprazole (PRILOSEC) 20 MG capsule Take 1 capsule (20 mg total) by mouth 2 (two) times daily before a meal.  . [DISCONTINUED] metoprolol tartrate (LOPRESSOR) 25 MG tablet Take 1 tablet (25 mg total) by mouth 2 (two) times daily.     Allergies:   Patient has no known allergies.   Social History   Socioeconomic History  . Marital status: Married    Spouse name: None  . Number of children: None  . Years of education: None  . Highest education level: None  Social Needs  . Financial resource strain: None  . Food insecurity - worry: None  . Food insecurity - inability: None  . Transportation needs - medical: None  . Transportation needs - non-medical: None  Occupational History  . None  Tobacco Use  . Smoking status: Never Smoker  . Smokeless tobacco: Never Used  Substance and Sexual Activity  . Alcohol use: Yes  . Drug use: No  . Sexual activity: None  Other Topics Concern  . None  Social History Narrative  . None     Family History: The patient's family history includes Diabetes in his brother and sister.  ROS:   Please see the history of present illness.     All other systems reviewed and are negative.  EKGs/Labs/Other Studies Reviewed:  The following studies were reviewed today: Hospital records, EKG, lab work  EKG:  none  Recent Labs: 02/10/2017: TSH 4.770 02/21/2017: ALT 32; Magnesium 2.4 03/11/2017: BUN 12; Creatinine, Ser 0.96; Hemoglobin 10.6; Platelets 313; Potassium 5.1; Sodium 140  Recent Lipid Panel    Component Value Date/Time   CHOL 233 (H) 02/10/2017 1252   TRIG 209 (H) 02/10/2017 1252   HDL 38 (L) 02/10/2017 1252   CHOLHDL 6.1 (H) 02/10/2017 1252   LDLCALC 153 (H) 02/10/2017 1252    Physical Exam:    VS:  BP 130/80   Pulse 96   Ht 5\' 8"   (1.727 m)   Wt 205 lb 12.8 oz (93.4 kg)   SpO2 99%   BMI 31.29 kg/m     Wt Readings from Last 3 Encounters:  05/06/17 205 lb 12.8 oz (93.4 kg)  04/11/17 202 lb (91.6 kg)  03/28/17 193 lb (87.5 kg)     GEN:  Well nourished, well developed in no acute distress HEENT: Normal NECK: No JVD; No carotid bruits LYMPHATICS: No lymphadenopathy CARDIAC: RRR, no murmurs, rubs, gallops, wound healed RESPIRATORY:  Clear to auscultation without rales, wheezing or rhonchi  ABDOMEN: Soft, non-tender, non-distended MUSCULOSKELETAL:  No edema; No deformity  SKIN: Warm and dry NEUROLOGIC:  Alert and oriented x 3 PSYCHIATRIC:  Normal affect   ASSESSMENT:    1. Hyperlipidemia, unspecified hyperlipidemia type   2. S/P CABG x 4   3. CAD in native artery   4. PAF (paroxysmal atrial fibrillation) (HCC)    PLAN:    In order of problems listed above:  Coronary artery disease/CABG/prior non-ST elevation myocardial infarction. -Doing well, no angina.  Continue with aspirin, okay to decrease to 81 mg.  Ischemic cardiomyopathy -We will change his metoprolol tartrate 25 mill grams twice a day to Toprol-XL 100 mg once a day.  Heart rate and blood pressure should be able to tolerate this.  Toprol is indicated for cardiomyopathy.  Last EF was 40% performed in November late after bypass surgery.  Hyperlipidemia -Continue with statin use.  Checking lipid panel.   Medication Adjustments/Labs and Tests Ordered: Current medicines are reviewed at length with the patient today.  Concerns regarding medicines are outlined above.  Orders Placed This Encounter  Procedures  . Lipid Profile   Meds ordered this encounter  Medications  . metoprolol succinate (TOPROL-XL) 100 MG 24 hr tablet    Sig: Take 1 tablet (100 mg total) by mouth daily. Take with or immediately following a meal.    Dispense:  90 tablet    Refill:  3    Signed, Donato Schultz, MD  05/06/2017 9:25 AM    [facility]

## 2017-05-06 NOTE — Patient Instructions (Addendum)
Medication Instructions:  Please stop Metoprolol tartrate and start Metoprolol succinate 100 mg a day. Continue all other medications as listed.  Labwork: Please have lipid panel drawn.  Follow-Up: Follow up in 4 months with [name], NP.  You will receive a letter in the mail 2 months before you are due.  Please call us when you receive this letter to schedule your follow up appointment.   If you need a refill on your cardiac medications before your next appointment, please call your pharmacy.  Thank you for choosing [facility]!!

## 2017-05-12 ENCOUNTER — Encounter: Payer: Self-pay | Admitting: *Deleted

## 2017-05-18 MED FILL — LISINOPRIL 10 MG TABS: 10 | 30 days supply | Qty: 30 | Fill #3

## 2017-05-18 MED FILL — ATORVASTATIN 80 MG TABLET: 80 | 30 days supply | Qty: 30 | Fill #3

## 2017-05-30 ENCOUNTER — Ambulatory Visit: Payer: Self-pay

## 2017-06-06 ENCOUNTER — Ambulatory Visit: Payer: Self-pay | Attending: Nurse Practitioner

## 2017-06-20 ENCOUNTER — Other Ambulatory Visit: Payer: Self-pay

## 2017-06-20 MED ORDER — ATORVASTATIN CALCIUM 80 MG PO TABS: 80.0000 mg | ORAL_TABLET | Freq: Every day | ORAL | 3 refills | Status: DC

## 2017-06-20 MED ORDER — LISINOPRIL 10 MG PO TABS: 10.0000 mg | ORAL_TABLET | Freq: Every day | ORAL | 3 refills | Status: DC

## 2017-06-20 NOTE — Addendum Note (Signed)
Addended by: Demetrios Loll on: 06/20/2017 02:54 PM   Modules accepted: Orders

## 2017-09-05 ENCOUNTER — Encounter: Payer: Self-pay | Admitting: Dietician

## 2017-09-05 ENCOUNTER — Encounter: Payer: Self-pay | Attending: Nurse Practitioner | Admitting: Dietician

## 2017-09-05 DIAGNOSIS — R7303 Prediabetes: Secondary | ICD-10-CM

## 2017-09-05 DIAGNOSIS — E785 Hyperlipidemia, unspecified: Secondary | ICD-10-CM

## 2017-09-05 DIAGNOSIS — Z713 Dietary counseling and surveillance: Secondary | ICD-10-CM | POA: Insufficient documentation

## 2017-09-05 DIAGNOSIS — I2510 Atherosclerotic heart disease of native coronary artery without angina pectoris: Secondary | ICD-10-CM | POA: Insufficient documentation

## 2017-09-05 DIAGNOSIS — I25729 Atherosclerosis of autologous artery coronary artery bypass graft(s) with unspecified angina pectoris: Secondary | ICD-10-CM | POA: Insufficient documentation

## 2017-09-05 NOTE — Progress Notes (Signed)
  Medical Nutrition Therapy:  Appt start time: 0915 end time:  1015.   Assessment:  Primary concerns today: Patient is here today with his partner and interpretor from [facility] [place] [phone]. She does the cooking.  He works as a Education administrator.  History includes MI, CABG 01/2017, HTN, hyperlipidemia.  They have reduced the fat and cheese in the diet as well as eating mostly tuna, salmon, other fish, and [place]. Labs:  02/10/17:  Cholesterol 233, HDL 38, LDL 153, triglycerides 209 and 05/06/17:  Cholesterol 117, HDL 40, LDL 59, Triglycerides 91 improved with change of diet and statin HgbA1C 5.8% 01/2017 meets criteria for prediabetes   Weight hx: 235 lbs prior to surgery 01/2017 Lost to 190 lbs in December  219 lbs today.  Preferred Learning Style:   No preference indicated   Learning Readiness:   Ready  Change in progress   MEDICATIONS: see list   DIETARY INTAKE:  Usual eating pattern includes 3 meals and 2 snacks per day.  24-hr recall:  B ( AM): cinnamon tea, sweet bread or cookie  Snk ( AM): non  L ( PM): Salad or tuna or [place] Sub at [employer] OR chicken tacos Snk ( PM): fruit (mango or orange) or nuts D ( PM): salad, vegetables, fish (grilled), plus occasional beans Snk ( PM): chocolate or other flavor of popsickle or candy Beverages: regular soda (1 daily), water, sandia (blended water and fruit, sugar), vitamin water  Usual physical activity: walks 30-60 minutes 3-4 times per week  Progress Towards Goal(s):  In progress.   Nutritional Diagnosis:  NB-1.1 Food and nutrition-related knowledge deficit As related to CAD.  As evidenced by patient and partner report.    Intervention:  Nutrition education regarding tips to increase vegetables, decrease fat and added sugar related to CAD and elevated A1C.  Explained his cholesterol numbers and label reading for beverages.  Discussed the benefits of exercise.  e active most day.  Walk on the weekend and after work when  able. Choose water, or fruit water rather than soda. Reduce the sugar in what you eat. Continue to eat your healthy diet  Bake rather than fry. Continue to increase your vegetable intake. Beans are very healthy and can be used rather than meat. Other options for breakfast:  Smoothie  Oatmeal with fruit and nuts  Whole grain toast with peanut butter and fruit  1 egg, fruit, whole grain toast     Teaching Method Utilized:  Visual Auditory  Handouts given during visit include:  Cholesterol Nutrition Therapy from AND in Spanish  Bake Broil Grill in Spanish  Spanish My Plate  Barriers to learning/adherence to lifestyle change: time, language barrier  Demonstrated degree of understanding via:  Teach Back   Monitoring/Evaluation:  Dietary intake, exercise, and body weight prn.

## 2017-09-05 NOTE — Patient Instructions (Signed)
Be active most day.  Walk on the weekend and after work when able. Choose water, or fruit water rather than soda. Reduce the sugar in what you eat. Continue to eat your healthy diet  Bake rather than fry. Continue to increase your vegetable intake. Beans are very healthy and can be used rather than meat. Other options for breakfast:  Smoothie  Oatmeal with fruit and nuts  Whole grain toast with peanut butter and fruit  1 egg, fruit, whole grain toast

## 2017-10-24 NOTE — Progress Notes (Signed)
Cardiology Office Note   Date:  10/25/2017   ID:  [name] [name], DOB [birth year], MRN [id number]  PCP:  Claiborne Rigg, NP  Cardiologist  Dr. Anne Fu    Chief Complaint  Patient presents with  . Coronary Artery Disease      History of Present Illness: [name] [name] is a 37 y.o. male who presents for CAD,    History of severe coronary artery disease status post CABG x4 on 02/15/17, Dr. Maren Beach.  Has ischemic cardiomyopathy.  EF 40% last checked in November 2018.    Last visit his BB was changed to toprolx XL daiy 100 mg daily.  Lipids in Jan HDL 40, LDL 59 T chol 117 and TG 91.  Today he feels great.  No chest pain and no SOB.  No lightheadedness.  He works as a Education administrator and tolerates it well.  No swelling.   His asprin is still at 325 mg will decrease to 81 mg as per Dr. Anne Fu suggestion.   He walks 30 min to 1 hour daily.  Has stopped salt in his diet.    Past Medical History:  Diagnosis Date  . Essential hypertension, benign   . Hyperlipidemia     Past Surgical History:  Procedure Laterality Date  . CORONARY ARTERY BYPASS GRAFT N/A 02/15/2017   Procedure: CORONARY ARTERY BYPASS GRAFTING (CABG), ON PUMP, TIMES FOUR, USING LEFT INTERNAL MAMMARY ARTERY AND ENDOSCOPICALLY HARVESTED RIGHT GREATER SAPHENOUS VEIN;  Surgeon: Kerin Perna, MD;  Location: [facility] OR;  Service: Open Heart Surgery;  Laterality: N/A;  LIMA to LAD, SVG to DIAGONAL, SVG to RAMUS INTERMEDIATE, SVG to RCA  . LEFT HEART CATH AND CORONARY ANGIOGRAPHY N/A 02/10/2017   Procedure: LEFT HEART CATH AND CORONARY ANGIOGRAPHY;  Surgeon: Yvonne Kendall, MD;  [facility];  Service: Cardiovascular;  Laterality: N/A;  . TEE WITHOUT CARDIOVERSION N/A 02/15/2017   Procedure: TRANSESOPHAGEAL ECHOCARDIOGRAM (TEE);  Surgeon: Donata Clay, Theron Arista, MD;  Location: [facility] OR;  Service: Open Heart Surgery;  Laterality: N/A;     Current Outpatient Medications  Medication Sig Dispense Refill  .  aspirin EC 325 MG EC tablet Take 1 tablet (325 mg total) by mouth daily. 30 tablet 0  . atorvastatin (LIPITOR) 80 MG tablet Take 1 tablet (80 mg total) by mouth daily at 6 PM. 90 tablet 3  . lisinopril (PRINIVIL,ZESTRIL) 10 MG tablet Take 1 tablet (10 mg total) by mouth daily. 90 tablet 3  . metoprolol succinate (TOPROL-XL) 100 MG 24 hr tablet Take 1 tablet (100 mg total) by mouth daily. Take with or immediately following a meal. 90 tablet 3   No current facility-administered medications for this visit.     Allergies:   Patient has no known allergies.    Social History:  The patient  reports that he has never smoked. He has never used smokeless tobacco. He reports that he drinks alcohol. He reports that he does not use drugs.   Family History:  The patient's family history includes Diabetes in his brother and sister.    ROS:  General:no colds or fevers, mild weight decrease.  Skin:no rashes or ulcers HEENT:no blurred vision, no congestion CV:see HPI PUL:see HPI GI:no diarrhea constipation or melena, no indigestion GU:no hematuria, no dysuria MS:no joint pain, no claudication Neuro:no syncope, no lightheadedness Endo:no diabetes, no thyroid disease  Wt Readings from Last 3 Encounters:  10/25/17 214 lb 1.9 oz (97.1 kg)  09/05/17 219 lb (99.3 kg)  05/06/17 205 lb 12.8  oz (93.4 kg)     PHYSICAL EXAM: VS:  BP 132/82   Pulse 84   Ht 5\' 8"  (1.727 m)   Wt 214 lb 1.9 oz (97.1 kg)   SpO2 97%   BMI 32.56 kg/m  , BMI Body mass index is 32.56 kg/m. General:Pleasant affect, NAD Skin:Warm and dry, brisk capillary refill HEENT:normocephalic, sclera clear, mucus membranes moist Neck:supple, no JVD, no bruits  Heart:S1S2 RRR without murmur, gallup, rub or click Lungs:clear without rales, rhonchi, or wheezes ZOX:WRUE, non tender, + BS, do not palpate liver spleen or masses Ext:no lower ext edema, 2+ pedal pulses, 2+ radial pulses Neuro:alert and oriented, MAE, follows commands, +  facial symmetry    EKG:  EKG is NOT ordered today.    Recent Labs: 02/10/2017: TSH 4.770 02/21/2017: ALT 32; Magnesium 2.4 03/11/2017: BUN 12; Creatinine, Ser 0.96; Hemoglobin 10.6; Platelets 313; Potassium 5.1; Sodium 140    Lipid Panel    Component Value Date/Time   CHOL 117 05/06/2017 0929   TRIG 91 05/06/2017 0929   HDL 40 05/06/2017 0929   CHOLHDL 2.9 05/06/2017 0929   LDLCALC 59 05/06/2017 0929       Other studies Reviewed: Additional studies/ records that were reviewed today include: . 10.18.18  . Procedures   LEFT HEART CATH AND CORONARY ANGIOGRAPHY  Conclusion   Conclusions: 1. Severe three vessel coronary artery disease, as detailed below. 2. Moderately reduced left ventricular ejection fraction with global hypokinesis. 3. Mildly elevated left ventricular filling pressure.  Recommendations: 1. Admit to 2 Heart. 2. IV NTG for BP control and anti-anginal therapy (though patient is currently chest pain free). 3. Restart heparin infusion 2 hours after TR band has been deflated. 4. Cardiac surgery consultation has been called (discussed with Dr. Donata Clay). 5. Statin therapy and metoprolol, as previously ordered. 6. Follow-up echo.     ECHO: 02/12/17  Study Conclusions  - Left ventricle: The cavity size was normal. There was moderate concentric hypertrophy. Systolic function was moderately to severely reduced. The estimated ejection fraction was in the range of 30% to 35%. Diffuse hypokinesis. Doppler parameters are consistent with abnormal left ventricular relaxation (grade 1 diastolic dysfunction). Doppler parameters are consistent with indeterminate ventricular filling pressure. - Aortic valve: Transvalvular velocity was within the normal range. There was no stenosis. There was no regurgitation. - Mitral valve: Transvalvular velocity was within the normal range. There was no evidence for stenosis. There was mild  regurgitation. - Left atrium: The atrium was mildly dilated. - Right ventricle: The cavity size was normal. Wall thickness was normal. Systolic function was normal. - Atrial septum: No defect or patent foramen ovale was identified. - Tricuspid valve: There was trivial regurgitation. - Pulmonary arteries: Systolic pressure was within the normal range. PA peak pressure: 22 mm Hg (S).  TEE 02/15/17  Study Conclusions  - Left ventricle: Systolic function was moderately reduced. The estimated ejection fraction was in the range of 35% to 45%. Diffuse global hypokinesis. - Staged echo: Limited Post- CPB exam: Significantly Improved LV function. Vigorous, concentric LV function with no wall motion abnormalities. EF 55-65%. RV function appears normal as well. No change post bypass in aortic valve function. No significant change post bypass in mitral valve function.  Impressions:  - LV function improvement from pre-bypass images. No other significant changes from pre-bypass images.  CABG 02/15/17 PROCEDURE:  Procedure(s):  CORONARY ARTERY BYPASS GRAFTING x 4 -LIMA to LAD -SVG to DIAGONAL -SVG to RAMUS INTERMEDIATE -SVG to RCA  ENDOSCOPIC  HARVEST GREATER SAPHENOUS VEIN -Right Leg  TRANSESOPHAGEAL ECHOCARDIOGRAM (TEE) (N/A)  Echo 03/22/17  Study Conclusions  - Left ventricle: The cavity size was mildly dilated. Wall   thickness was normal. The estimated ejection fraction was 40%.   Septal and inferior hypokinesis. Apical akinesis. Features are   consistent with a pseudonormal left ventricular filling pattern,   with concomitant abnormal relaxation and increased filling   pressure (grade 2 diastolic dysfunction). - Aortic valve: There was no stenosis. - Mitral valve: There was trivial regurgitation. - Right ventricle: The cavity size was normal. Systolic function   was mildly reduced. - Tricuspid valve: Peak RV-RA gradient (S): 22 mm Hg. -  Pulmonary arteries: PA peak pressure: 25 mm Hg (S). - Inferior vena cava: The vessel was normal in size. The   respirophasic diameter changes were in the normal range (>= 50%),   consistent with normal central venous pressure.  Impressions:  - Mildly dilated LV with EF 40%, wall motion abnormalities as noted   above. Moderate diastolic dysfunction. Normal RV size with mildly   decreased systolic function. No significant valvular   abnormalities.   ASSESSMENT AND PLAN:  1.  CAD with CABG X 4 02/15/17  Has done well, no chest pain and no SOB.  Follow up with Dr. Anne Fu in 6 months.   2.  HX of MI 01/2017 with decrease in Ef, no further angina.  3.  ICM with EF on last echo 40% up from 30-35%.  Continue medications. euvolemic today.   4.  HLD on lipitor 80 and last lipids excellent.      Current medicines are reviewed with the patient today.  The patient Has no concerns regarding medicines.  The following changes have been made:  See above Labs/ tests ordered today include:see above  Disposition:   FU:  see above  Signed, [name], NP  10/25/2017 10:35 AM    [facility] [address] [place], [place], [place]  [address]/ [address] [place], [place] Phone: [phone]; Fax: [phone]  [phone]

## 2017-10-25 ENCOUNTER — Encounter: Payer: Self-pay | Admitting: Cardiology

## 2017-10-25 ENCOUNTER — Encounter (INDEPENDENT_AMBULATORY_CARE_PROVIDER_SITE_OTHER): Payer: Self-pay

## 2017-10-25 ENCOUNTER — Ambulatory Visit (INDEPENDENT_AMBULATORY_CARE_PROVIDER_SITE_OTHER): Payer: No Typology Code available for payment source | Admitting: Cardiology

## 2017-10-25 VITALS — BP 132/82 | HR 84 | Ht 68.0 in | Wt 214.1 lb

## 2017-10-25 DIAGNOSIS — Z951 Presence of aortocoronary bypass graft: Secondary | ICD-10-CM

## 2017-10-25 DIAGNOSIS — E782 Mixed hyperlipidemia: Secondary | ICD-10-CM

## 2017-10-25 DIAGNOSIS — I2510 Atherosclerotic heart disease of native coronary artery without angina pectoris: Secondary | ICD-10-CM

## 2017-10-25 DIAGNOSIS — I255 Ischemic cardiomyopathy: Secondary | ICD-10-CM

## 2017-10-25 MED ORDER — ASPIRIN EC 81 MG PO TBEC: 81.0000 mg | DELAYED_RELEASE_TABLET | Freq: Every day | ORAL | 3 refills | Status: AC

## 2017-10-25 NOTE — Patient Instructions (Addendum)
Medication Instructions:  1) DECREASE Aspirin to 81mg  once daily  Labwork: None  Testing/Procedures: None  Follow-up Your physician recommends that you schedule a follow-up appointment in: 6 months with Dr. Anne Fu.    Any Other Special Instructions Will Be Listed Below (If Applicable).     If you need a refill on your cardiac medications before your next appointment, please call your pharmacy.

## 2018-04-26 IMAGING — DX DG CHEST 2V
2 series · 2 of 2 positions shown · non-contrast
Comparison: 02/18/2017

CLINICAL DATA: CABG.

EXAM:
CHEST  2 VIEW

[chest pa]
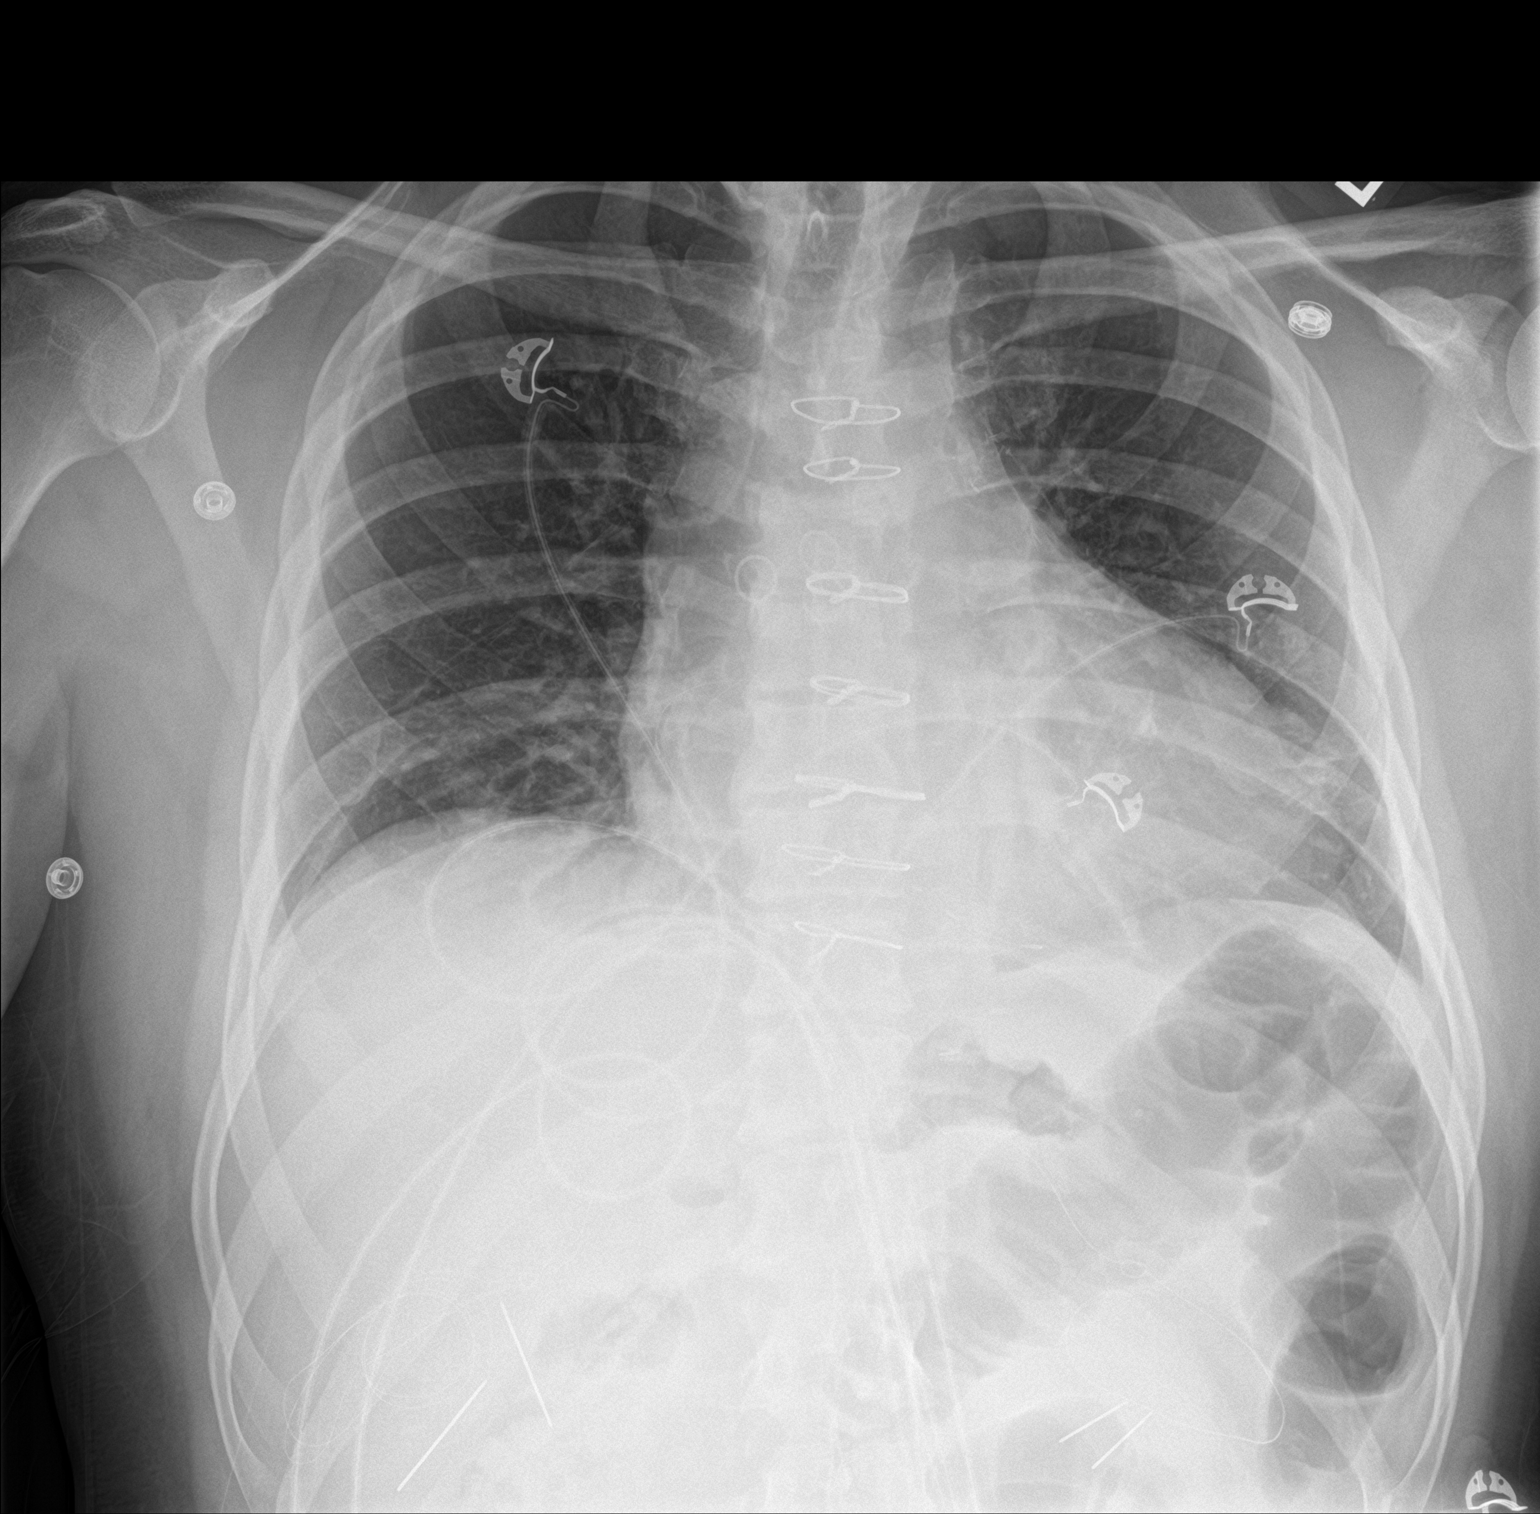

[chest lat]
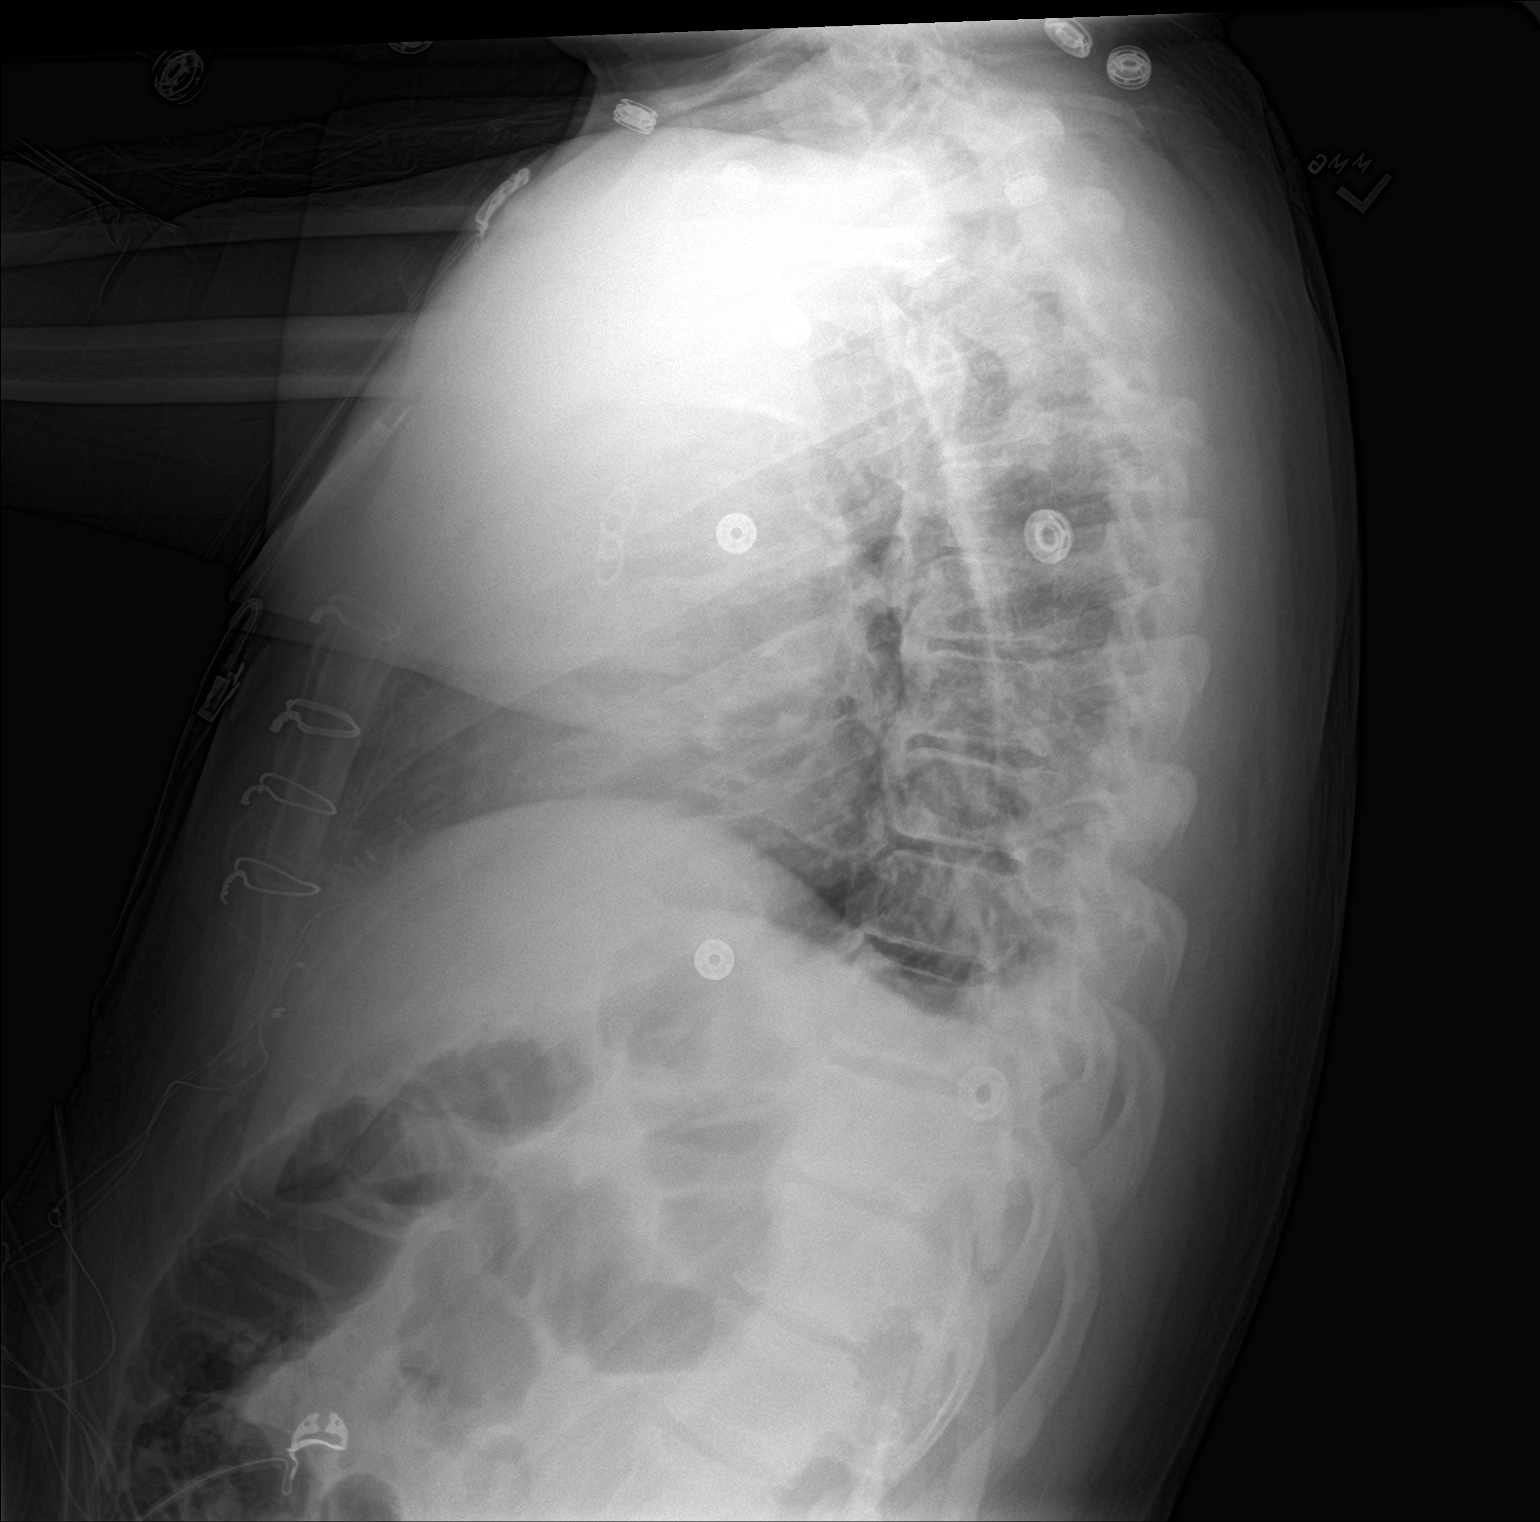

[2 of 2 positions shown; findings below may reference images not displayed]

FINDINGS: Sequelae of CABG are again identified. Right jugular sheath has been
removed. The cardiac silhouette remains mildly enlarged. Left
basilar aeration has improved, and there is mild residual
atelectasis. Minimal right basilar atelectasis is noted. There is no
evidence of edema or sizable pleural effusion. A tiny left apical
pneumothorax persists.
IMPRESSION: 1. Mild bibasilar atelectasis, improved on the left.
2. Persistent tiny left apical pneumothorax.

## 2018-04-27 ENCOUNTER — Ambulatory Visit (INDEPENDENT_AMBULATORY_CARE_PROVIDER_SITE_OTHER): Payer: No Typology Code available for payment source | Admitting: Cardiology

## 2018-04-27 ENCOUNTER — Encounter: Payer: Self-pay | Admitting: Cardiology

## 2018-04-27 VITALS — BP 136/90 | HR 70 | Ht 68.0 in | Wt 220.0 lb

## 2018-04-27 DIAGNOSIS — I255 Ischemic cardiomyopathy: Secondary | ICD-10-CM

## 2018-04-27 DIAGNOSIS — I2510 Atherosclerotic heart disease of native coronary artery without angina pectoris: Secondary | ICD-10-CM

## 2018-04-27 DIAGNOSIS — I517 Cardiomegaly: Secondary | ICD-10-CM

## 2018-04-27 DIAGNOSIS — Z951 Presence of aortocoronary bypass graft: Secondary | ICD-10-CM

## 2018-04-27 MED ORDER — METOPROLOL SUCCINATE ER 100 MG PO TB24: 100.0000 mg | ORAL_TABLET | Freq: Every day | ORAL | 3 refills | Status: DC

## 2018-04-27 MED ORDER — ATORVASTATIN CALCIUM 80 MG PO TABS: 80.0000 mg | ORAL_TABLET | Freq: Every day | ORAL | 3 refills | Status: DC

## 2018-04-27 MED ORDER — LISINOPRIL 20 MG PO TABS: 20.0000 mg | ORAL_TABLET | Freq: Every day | ORAL | 3 refills | Status: DC

## 2018-04-27 NOTE — Progress Notes (Signed)
Cardiology Office Note:    Date:  04/27/2018   ID:  [name] [name], DOB 12/16/1980, MRN [id number]  PCP:  Claiborne Rigg, NP  Cardiologist:  Donato Schultz, MD  Electrophysiologist:  None   Referring MD: Claiborne Rigg, NP     History of Present Illness:    [name] [name] is a 38 y.o. male here for follow-up of coronary artery disease status post CABG times 410/23/18, Dr. Maren Beach with EF 40% in November 2018.  Feels really well, [name], taking his medications.  No chest pain fevers chills nausea vomiting shortness of breath.  Prior office visit from [name], NP reviewed from 10/25/2017.  LDL was 59.  Overall he has been doing quite well.  He has gained some weight, likely from increased caloric intake over the past 2 or 3 months.  We discussed.  He is not having any chest pain.  He does state that at the vein harvest site he has some numbness in his leg.  We discussed this is normal.  He is having no motor dysfunction.  His blood pressure is mildly elevated with diastolic of 90.  We will increase his lisinopril.  Past Medical History:  Diagnosis Date  . Essential hypertension, benign   . Hyperlipidemia     Past Surgical History:  Procedure Laterality Date  . CORONARY ARTERY BYPASS GRAFT N/A 02/15/2017   Procedure: CORONARY ARTERY BYPASS GRAFTING (CABG), ON PUMP, TIMES FOUR, USING LEFT INTERNAL MAMMARY ARTERY AND ENDOSCOPICALLY HARVESTED RIGHT GREATER SAPHENOUS VEIN;  Surgeon: Kerin Perna, MD;  Location: [facility] OR;  Service: Open Heart Surgery;  Laterality: N/A;  LIMA to LAD, SVG to DIAGONAL, SVG to RAMUS INTERMEDIATE, SVG to RCA  . LEFT HEART CATH AND CORONARY ANGIOGRAPHY N/A 02/10/2017   Procedure: LEFT HEART CATH AND CORONARY ANGIOGRAPHY;  Surgeon: Yvonne Kendall, MD;  Location: [facility];  Service: Cardiovascular;  Laterality: N/A;  . TEE WITHOUT CARDIOVERSION N/A 02/15/2017   Procedure: TRANSESOPHAGEAL ECHOCARDIOGRAM (TEE);  Surgeon: Donata Clay, Theron Arista, MD;  Location: [facility] OR;  Service: Open Heart Surgery;  Laterality: N/A;    Current Medications: Current Meds  Medication Sig  . aspirin EC 81 MG tablet Take 1 tablet (81 mg total) by mouth daily.  Marland Kitchen atorvastatin (LIPITOR) 80 MG tablet Take 1 tablet (80 mg total) by mouth daily at 6 PM.  . lisinopril (PRINIVIL,ZESTRIL) 20 MG tablet Take 1 tablet (20 mg total) by mouth daily.  . metoprolol succinate (TOPROL-XL) 100 MG 24 hr tablet Take 1 tablet (100 mg total) by mouth daily. Take with or immediately following a meal.  . [DISCONTINUED] lisinopril (PRINIVIL,ZESTRIL) 10 MG tablet Take 1 tablet (10 mg total) by mouth daily.  . [DISCONTINUED] metoprolol succinate (TOPROL-XL) 100 MG 24 hr tablet Take 1 tablet (100 mg total) by mouth daily. Take with or immediately following a meal.     Allergies:   Patient has no known allergies.   Social History   Socioeconomic History  . Marital status: Married    Spouse name: Not on file  . Number of children: Not on file  . Years of education: Not on file  . Highest education level: Not on file  Occupational History  . Not on file  Social Needs  . Financial resource strain: Not on file  . Food insecurity:    Worry: Not on file    Inability: Not on file  . Transportation needs:    Medical: Not on file    Non-medical: Not  on file  Tobacco Use  . Smoking status: Never Smoker  . Smokeless tobacco: Never Used  Substance and Sexual Activity  . Alcohol use: Yes  . Drug use: No  . Sexual activity: Not on file  Lifestyle  . Physical activity:    Days per week: Not on file    Minutes per session: Not on file  . Stress: Not on file  Relationships  . Social connections:    Talks on phone: Not on file    Gets together: Not on file    Attends religious service: Not on file    Active member of club or organization: Not on file    Attends meetings of clubs or organizations: Not on file    Relationship status: Not on file  Other Topics  Concern  . Not on file  Social History Narrative  . Not on file     Family History: The patient's family history includes Diabetes in his brother and sister. ROS:   Please see the history of present illness.     All other systems reviewed and are negative.  EKGs/Labs/Other Studies Reviewed:    The following studies were reviewed today:  CABG 02/15/17 PROCEDURE:Procedure(s):  CORONARY ARTERY BYPASS GRAFTINGx 4 -LIMA to LAD -SVG to DIAGONAL -SVG to RAMUS INTERMEDIATE -SVG to RCA  ENDOSCOPIC HARVEST GREATER SAPHENOUS VEIN -Right Leg  TRANSESOPHAGEAL ECHOCARDIOGRAM (TEE) (N/A)  Echo 03/22/17  Study Conclusions  - Left ventricle: The cavity size was mildly dilated. Wall thickness was normal. The estimated ejection fraction was 40%. Septal and inferior hypokinesis. Apical akinesis. Features are consistent with a pseudonormal left ventricular filling pattern, with concomitant abnormal relaxation and increased filling pressure (grade 2 diastolic dysfunction). - Aortic valve: There was no stenosis. - Mitral valve: There was trivial regurgitation. - Right ventricle: The cavity size was normal. Systolic function was mildly reduced. - Tricuspid valve: Peak RV-RA gradient (S): 22 mm Hg. - Pulmonary arteries: PA peak pressure: 25 mm Hg (S). - Inferior vena cava: The vessel was normal in size. The respirophasic diameter changes were in the normal range (>= 50%), consistent with normal central venous pressure.    EKG:  EKG is  ordered today.  The ekg ordered today demonstrates sinus rhythm 70 with diffuse T wave inversions noted especially in the lateral leads, J-point elevation in 3 and F, LVH-like pattern.  When compared to prior, there is no longer biphasic T waves in V2.  Recent Labs: No results found for requested labs within last 8760 hours.  Recent Lipid Panel    Component Value Date/Time   CHOL 117 05/06/2017 0929   TRIG 91 05/06/2017 0929     HDL 40 05/06/2017 0929   CHOLHDL 2.9 05/06/2017 0929   LDLCALC 59 05/06/2017 0929    Physical Exam:    VS:  BP 136/90   Pulse 70   Ht 5\' 8"  (1.727 m)   Wt 220 lb (99.8 kg)   BMI 33.45 kg/m     Wt Readings from Last 3 Encounters:  04/27/18 220 lb (99.8 kg)  10/25/17 214 lb 1.9 oz (97.1 kg)  09/05/17 219 lb (99.3 kg)     GEN:  Well nourished, well developed in no acute distress HEENT: Normal NECK: No JVD; No carotid bruits LYMPHATICS: No lymphadenopathy CARDIAC: RRR, no murmurs, rubs, gallops, CABG scar RESPIRATORY:  Clear to auscultation without rales, wheezing or rhonchi  ABDOMEN: Soft, non-tender, non-distended MUSCULOSKELETAL:  No edema; No deformity  SKIN: Warm and dry NEUROLOGIC:  Alert and oriented x 3 PSYCHIATRIC:  Normal affect   ASSESSMENT:    1. CAD in native artery   2. S/P CABG x 4   3. Ischemic cardiomyopathy   4. LVH (left ventricular hypertrophy)    PLAN:    In order of problems listed above:  Coronary artery disease status post CABG 2018 - Continue with current aggressive secondary risk factor prevention.  Medications reviewed.  Discussed weight loss, decreasing carbohydrates.  Prior myocardial infarction October 2018 - EF originally was 30%, up to 40% postoperative  Ischemic cardiomyopathy - Medications reviewed.  Continue.  NYHA class I.  I will increase his lisinopril to 20 mg.  Continue with Toprol.  Hyperlipidemia -High intensity atorvastatin 80 mg.  LDL 54 previously.  Postoperative vein harvest site numbness -Explained to him that this is to be expected.  May take quite a long time before nerve growth returns.  Translator present   Medication Adjustments/Labs and Tests Ordered: Current medicines are reviewed at length with the patient today.  Concerns regarding medicines are outlined above.  Orders Placed This Encounter  Procedures  . EKG 12-Lead   Meds ordered this encounter  Medications  . lisinopril (PRINIVIL,ZESTRIL) 20  MG tablet    Sig: Take 1 tablet (20 mg total) by mouth daily.    Dispense:  90 tablet    Refill:  3  . metoprolol succinate (TOPROL-XL) 100 MG 24 hr tablet    Sig: Take 1 tablet (100 mg total) by mouth daily. Take with or immediately following a meal.    Dispense:  90 tablet    Refill:  3    Patient Instructions  Medication Instructions:  Please increase your Lisinopril to 20 mg daily. continue all other medications as listed.  If you need a refill on your cardiac medications before your next appointment, please call your pharmacy.   Follow-Up: At [facility], you and your health needs are our priority.  As part of our continuing mission to provide you with exceptional heart care, we have created designated Provider Care Teams.  These Care Teams include your primary Cardiologist (physician) and Advanced Practice Providers (APPs -  Physician Assistants and Nurse Practitioners) who all work together to provide you with the care you need, when you need it. You will need a follow up appointment in 12 months.  Please call our office 2 months in advance to schedule this appointment.  You may see Donato Schultz, MD or one of the following Advanced Practice Providers on your designated Care Team:   Norma Fredrickson, NP [name], NP . Georgie Chard, NP  Thank you for choosing [facility]!!        Signed, Donato Schultz, MD  04/27/2018 10:28 AM    [facility]

## 2018-04-27 NOTE — Patient Instructions (Signed)
Medication Instructions:  Please increase your Lisinopril to 20 mg daily. continue all other medications as listed.  If you need a refill on your cardiac medications before your next appointment, please call your pharmacy.   Follow-Up: At [facility], you and your health needs are our priority.  As part of our continuing mission to provide you with exceptional heart care, we have created designated Provider Care Teams.  These Care Teams include your primary Cardiologist (physician) and Advanced Practice Providers (APPs -  Physician Assistants and Nurse Practitioners) who all work together to provide you with the care you need, when you need it. You will need a follow up appointment in 12 months.  Please call our office 2 months in advance to schedule this appointment.  You may see Donato Schultz, MD or one of the following Advanced Practice Providers on your designated Care Team:   Norma Fredrickson, NP [name], NP . Georgie Chard, NP  Thank you for choosing [facility]!!

## 2018-04-27 NOTE — Addendum Note (Signed)
Addended by: Sharin Grave on: 04/27/2018 10:31 AM   Modules accepted: Orders

## 2019-05-07 ENCOUNTER — Ambulatory Visit (INDEPENDENT_AMBULATORY_CARE_PROVIDER_SITE_OTHER): Payer: Self-pay | Admitting: Cardiology

## 2019-05-07 ENCOUNTER — Encounter: Payer: Self-pay | Admitting: Cardiology

## 2019-05-07 ENCOUNTER — Other Ambulatory Visit: Payer: Self-pay

## 2019-05-07 VITALS — BP 134/80 | HR 72 | Ht 68.0 in | Wt 232.0 lb

## 2019-05-07 DIAGNOSIS — I2510 Atherosclerotic heart disease of native coronary artery without angina pectoris: Secondary | ICD-10-CM

## 2019-05-07 DIAGNOSIS — Z951 Presence of aortocoronary bypass graft: Secondary | ICD-10-CM

## 2019-05-07 DIAGNOSIS — E782 Mixed hyperlipidemia: Secondary | ICD-10-CM

## 2019-05-07 DIAGNOSIS — I255 Ischemic cardiomyopathy: Secondary | ICD-10-CM

## 2019-05-07 LAB — COMPREHENSIVE METABOLIC PANEL
ALT: 28 IU/L (ref 0–44)
AST: 19 IU/L (ref 0–40)
Albumin/Globulin Ratio: 1.7 (ref 1.2–2.2)
Albumin: 4.8 g/dL (ref 4.0–5.0)
Alkaline Phosphatase: 87 IU/L (ref 39–117)
BUN/Creatinine Ratio: 11 (ref 9–20)
BUN: 10 mg/dL (ref 6–20)
Bilirubin Total: 0.5 mg/dL (ref 0.0–1.2)
CO2: 21 mmol/L (ref 20–29)
Calcium: 9.2 mg/dL (ref 8.7–10.2)
Chloride: 102 mmol/L (ref 96–106)
Creatinine, Ser: 0.94 mg/dL (ref 0.76–1.27)
GFR calc Af Amer: 118 mL/min/{1.73_m2} (ref >59)
GFR calc non Af Amer: 102 mL/min/{1.73_m2} (ref >59)
Globulin, Total: 2.8 g/dL (ref 1.5–4.5)
Glucose: 98 mg/dL (ref 65–99)
Potassium: 5 mmol/L (ref 3.5–5.2)
Sodium: 138 mmol/L (ref 134–144)
Total Protein: 7.6 g/dL (ref 6.0–8.5)

## 2019-05-07 LAB — CBC
Hematocrit: 38.8 % (ref 37.5–51.0)
Hemoglobin: 12.2 g/dL — ABNORMAL LOW (ref 13.0–17.7)
MCH: 23.5 pg — ABNORMAL LOW (ref 26.6–33.0)
MCHC: 31.4 g/dL — ABNORMAL LOW (ref 31.5–35.7)
MCV: 75 fL — ABNORMAL LOW (ref 79–97)
Platelets: 158 10*3/uL (ref 150–450)
RBC: 5.19 x10E6/uL (ref 4.14–5.80)
RDW: 17 % — ABNORMAL HIGH (ref 11.6–15.4)
WBC: 7.6 10*3/uL (ref 3.4–10.8)

## 2019-05-07 LAB — LIPID PANEL
Chol/HDL Ratio: 3 ratio (ref 0.0–5.0)
Cholesterol, Total: 125 mg/dL (ref 100–199)
HDL: 42 mg/dL (ref >39)
LDL Chol Calc (NIH): 66 mg/dL (ref 0–99)
Triglycerides: 91 mg/dL (ref 0–149)
VLDL Cholesterol Cal: 17 mg/dL (ref 5–40)

## 2019-05-07 MED ORDER — ATORVASTATIN CALCIUM 80 MG PO TABS: 80.0000 mg | ORAL_TABLET | Freq: Every day | ORAL | 2 refills | Status: DC

## 2019-05-07 MED ORDER — METOPROLOL SUCCINATE ER 100 MG PO TB24: 100.0000 mg | ORAL_TABLET | Freq: Every day | ORAL | 3 refills | Status: DC

## 2019-05-07 MED ORDER — LISINOPRIL 20 MG PO TABS: 20.0000 mg | ORAL_TABLET | Freq: Every day | ORAL | 2 refills | Status: DC

## 2019-05-07 NOTE — Patient Instructions (Signed)
Medication Instructions:   Your physician recommends that you continue on your current medications as directed. Please refer to the Current Medication list given to you today.  *If you need a refill on your cardiac medications before your next appointment, please call your pharmacy*   Lab Work:  TODAY--CMET, CBC, AND LIPIDS  If you have labs (blood work) drawn today and your tests are completely normal, you will receive your results only by: Marland Kitchen MyChart Message (if you have MyChart) OR . A paper copy in the mail If you have any lab test that is abnormal or we need to change your treatment, we will call you to review the results.   Testing/Procedures:  Your physician has requested that you have an echocardiogram. Echocardiography is a painless test that uses sound waves to create images of your heart. It provides your doctor with information about the size and shape of your heart and how well your heart's chambers and valves are working. This procedure takes approximately one hour. There are no restrictions for this procedure.    Follow-Up: At [facility], you and your health needs are our priority.  As part of our continuing mission to provide you with exceptional heart care, we have created designated Provider Care Teams.  These Care Teams include your primary Cardiologist (physician) and Advanced Practice Providers (APPs -  Physician Assistants and Nurse Practitioners) who all work together to provide you with the care you need, when you need it.  Your next appointment:   12 month(s)  The format for your next appointment:   In Person  Provider:   Donato Schultz, MD

## 2019-05-07 NOTE — Progress Notes (Signed)
Cardiology Office Note:    Date:  05/07/2019   ID:  Etienne Mowers, DOB [birth year], MRN [id number]  PCP:  Gildardo Pounds, NP  Cardiologist:  Candee Furbish, MD  Electrophysiologist:  None   Referring MD: Gildardo Pounds, NP     History of Present Illness:    [name] [name] is a 39 y.o. male here for follow-up of coronary artery disease status post CABG times 410/23/18, Dr. Darcey Nora with EF 40% in November 2018.  Feels really well, Sharyon Cable, taking his medications.  No chest pain fevers chills nausea vomiting shortness of breath.  Prior office visit from Cecilie Kicks, NP reviewed from 10/25/2017.  LDL was 59.  Overall he has been doing quite well.  He has gained some weight, likely from increased caloric intake over the past 2 or 3 months.  We discussed.  He is not having any chest pain.  He does state that at the vein harvest site he has some numbness in his leg.  We discussed this is normal.  He is having no motor dysfunction.  His blood pressure is mildly elevated with diastolic of 90.  We will increase his lisinopril.  Past Medical History:  Diagnosis Date  . Essential hypertension, benign   . Hyperlipidemia     Past Surgical History:  Procedure Laterality Date  . CORONARY ARTERY BYPASS GRAFT N/A 02/15/2017   Procedure: CORONARY ARTERY BYPASS GRAFTING (CABG), ON PUMP, TIMES FOUR, USING LEFT INTERNAL MAMMARY ARTERY AND ENDOSCOPICALLY HARVESTED RIGHT GREATER SAPHENOUS VEIN;  Surgeon: Ivin Poot, MD;  Location: [place];  Service: Open Heart Surgery;  Laterality: N/A;  LIMA to LAD, SVG to DIAGONAL, SVG to RAMUS INTERMEDIATE, SVG to RCA  . LEFT HEART CATH AND CORONARY ANGIOGRAPHY N/A 02/10/2017   Procedure: LEFT HEART CATH AND CORONARY ANGIOGRAPHY;  Surgeon: Nelva Bush, MD;  Location: [place] CV LAB;  Service: Cardiovascular;  Laterality: N/A;  . TEE WITHOUT CARDIOVERSION N/A 02/15/2017   Procedure: TRANSESOPHAGEAL ECHOCARDIOGRAM (TEE);  Surgeon: Prescott Gum, Collier Salina, MD;  Location: [place];  Service: Open Heart Surgery;  Laterality: N/A;    Current Medications: Current Meds  Medication Sig  . aspirin EC 81 MG tablet Take 1 tablet (81 mg total) by mouth daily.  Marland Kitchen atorvastatin (LIPITOR) 80 MG tablet Take 1 tablet (80 mg total) by mouth daily at 6 PM.  . lisinopril (ZESTRIL) 20 MG tablet Take 1 tablet (20 mg total) by mouth daily.  . metoprolol succinate (TOPROL-XL) 100 MG 24 hr tablet Take 1 tablet (100 mg total) by mouth daily. Take with or immediately following a meal.  . [DISCONTINUED] atorvastatin (LIPITOR) 80 MG tablet Take 1 tablet (80 mg total) by mouth daily at 6 PM.  . [DISCONTINUED] lisinopril (PRINIVIL,ZESTRIL) 20 MG tablet Take 1 tablet (20 mg total) by mouth daily.  . [DISCONTINUED] metoprolol succinate (TOPROL-XL) 100 MG 24 hr tablet Take 1 tablet (100 mg total) by mouth daily. Take with or immediately following a meal.     Allergies:   Patient has no known allergies.   Social History   Socioeconomic History  . Marital status: Married    Spouse name: Not on file  . Number of children: Not on file  . Years of education: Not on file  . Highest education level: Not on file  Occupational History  . Not on file  Tobacco Use  . Smoking status: Never Smoker  . Smokeless tobacco: Never Used  Substance and Sexual Activity  . Alcohol use: Yes  .  Drug use: No  . Sexual activity: Not on file  Other Topics Concern  . Not on file  Social History Narrative  . Not on file   Social Determinants of Health   Financial Resource Strain:   . Difficulty of Paying Living Expenses: Not on file  Food Insecurity:   . Worried About Charity fundraiser in the Last Year: Not on file  . Ran Out of Food in the Last Year: Not on file  Transportation Needs:   . Lack of Transportation (Medical): Not on file  . Lack of Transportation (Non-Medical): Not on file  Physical Activity:   . Days of Exercise per Week: Not on file  . Minutes of  Exercise per Session: Not on file  Stress:   . Feeling of Stress : Not on file  Social Connections:   . Frequency of Communication with Friends and Family: Not on file  . Frequency of Social Gatherings with Friends and Family: Not on file  . Attends Religious Services: Not on file  . Active Member of Clubs or Organizations: Not on file  . Attends Archivist Meetings: Not on file  . Marital Status: Not on file     Family History: The patient's family history includes Diabetes in his brother and sister. ROS:   Please see the history of present illness.     All other systems reviewed and are negative.  EKGs/Labs/Other Studies Reviewed:    The following studies were reviewed today:  CABG 02/15/17 PROCEDURE:Procedure(s):  CORONARY ARTERY BYPASS GRAFTINGx 4 -LIMA to LAD -SVG to DIAGONAL -SVG to RAMUS INTERMEDIATE -SVG to RCA  ENDOSCOPIC HARVEST GREATER SAPHENOUS VEIN -Right Leg  TRANSESOPHAGEAL ECHOCARDIOGRAM (TEE) (N/A)  Echo 03/22/17  Study Conclusions  - Left ventricle: The cavity size was mildly dilated. Wall thickness was normal. The estimated ejection fraction was 40%. Septal and inferior hypokinesis. Apical akinesis. Features are consistent with a pseudonormal left ventricular filling pattern, with concomitant abnormal relaxation and increased filling pressure (grade 2 diastolic dysfunction). - Aortic valve: There was no stenosis. - Mitral valve: There was trivial regurgitation. - Right ventricle: The cavity size was normal. Systolic function was mildly reduced. - Tricuspid valve: Peak RV-RA gradient (S): 22 mm Hg. - Pulmonary arteries: PA peak pressure: 25 mm Hg (S). - Inferior vena cava: The vessel was normal in size. The respirophasic diameter changes were in the normal range (>= 50%), consistent with normal central venous pressure.    EKG:  EKG is  ordered today.  The ekg ordered today demonstrates 05/07/2019-sinus rhythm  72 T wave inversions again in the inferolateral leads with J-point elevation in 3.  No change from prior sinus rhythm 70 with diffuse T wave inversions noted especially in the lateral leads, J-point elevation in 3 and F, LVH-like pattern.  When compared to prior, there is no longer biphasic T waves in V2.  Recent Labs: No results found for requested labs within last 8760 hours.  Recent Lipid Panel    Component Value Date/Time   CHOL 117 05/06/2017 0929   TRIG 91 05/06/2017 0929   HDL 40 05/06/2017 0929   CHOLHDL 2.9 05/06/2017 0929   LDLCALC 59 05/06/2017 0929    Physical Exam:    VS:  BP 134/80   Pulse 72   Ht '5\' 8"'$  (1.727 m)   Wt 232 lb (105.2 kg)   BMI 35.28 kg/m     Wt Readings from Last 3 Encounters:  05/07/19 232 lb (105.2 kg)  04/27/18 220 lb (99.8 kg)  10/25/17 214 lb 1.9 oz (97.1 kg)    GEN: Well nourished, well developed, in no acute distress  HEENT: normal  Neck: no JVD, carotid bruits, or masses Cardiac: RRR; no murmurs, rubs, or gallops,no edema, CABG scar noted Respiratory:  clear to auscultation bilaterally, normal work of breathing GI: soft, nontender, nondistended, + BS MS: no deformity or atrophy  Skin: warm and dry, no rash Neuro:  Alert and Oriented x 3, Strength and sensation are intact Psych: euthymic mood, full affect   ASSESSMENT:    1. S/P CABG x 4   2. CAD in native artery   3. Ischemic cardiomyopathy   4. Mixed hyperlipidemia    PLAN:    In order of problems listed above:  Coronary artery disease status post CABG 2018 - Continue with current aggressive secondary risk factor prevention.  Medications reviewed.  Discussed weight loss, decreasing carbohydrates.  Overall continues to do well with medications, aspirin atorvastatin lisinopril and metoprolol.  Prior myocardial infarction October 2018 - EF originally was 30%, up to 40% postoperative, since it has been 2 years, we will take another look at echocardiogram.  I am curious to see  whether or not his EF continue to improve.  Ischemic cardiomyopathy - Medications reviewed.  Continue.  NYHA class I.  Continue lisinopril  20 mg.  Continue with Toprol.  Checking renal function.  Checking CBC since he is on aspirin.  Hyperlipidemia -High intensity atorvastatin 80 mg.  LDL 54 previously.  We are going to check lipid panel again today.  Check liver functions.  He does state that towards the end of the day after going up and down stairs several times, he can get some cramping in his thighs.  I am not convinced that this is a statin side effect given his overall vigorous activity.  I have encouraged daily banana.  Postoperative vein harvest site numbness -Explained to him that this is to be expected.  May take quite a long time before nerve growth returns.  Did not have any complaints about this during this visit.  Translator present, appreciated.   Medication Adjustments/Labs and Tests Ordered: Current medicines are reviewed at length with the patient today.  Concerns regarding medicines are outlined above.  Orders Placed This Encounter  Procedures  . Comp Met (CMET)  . CBC  . Lipid Profile  . EKG 12-Lead  . ECHOCARDIOGRAM COMPLETE   Meds ordered this encounter  Medications  . lisinopril (ZESTRIL) 20 MG tablet    Sig: Take 1 tablet (20 mg total) by mouth daily.    Dispense:  90 tablet    Refill:  2  . atorvastatin (LIPITOR) 80 MG tablet    Sig: Take 1 tablet (80 mg total) by mouth daily at 6 PM.    Dispense:  90 tablet    Refill:  2  . metoprolol succinate (TOPROL-XL) 100 MG 24 hr tablet    Sig: Take 1 tablet (100 mg total) by mouth daily. Take with or immediately following a meal.    Dispense:  90 tablet    Refill:  3    Patient Instructions  Medication Instructions:   Your physician recommends that you continue on your current medications as directed. Please refer to the Current Medication list given to you today.  *If you need a refill on your cardiac  medications before your next appointment, please call your pharmacy*   Lab Work:  TODAY--CMET, CBC, AND LIPIDS  If you  have labs (blood work) drawn today and your tests are completely normal, you will receive your results only by: Marland Kitchen MyChart Message (if you have MyChart) OR . A paper copy in the mail If you have any lab test that is abnormal or we need to change your treatment, we will call you to review the results.   Testing/Procedures:  Your physician has requested that you have an echocardiogram. Echocardiography is a painless test that uses sound waves to create images of your heart. It provides your doctor with information about the size and shape of your heart and how well your heart's chambers and valves are working. This procedure takes approximately one hour. There are no restrictions for this procedure.    Follow-Up: At [facility], you and your health needs are our priority.  As part of our continuing mission to provide you with exceptional heart care, we have created designated Provider Care Teams.  These Care Teams include your primary Cardiologist (physician) and Advanced Practice Providers (APPs -  Physician Assistants and Nurse Practitioners) who all work together to provide you with the care you need, when you need it.  Your next appointment:   12 month(s)  The format for your next appointment:   In Person  Provider:   Candee Furbish, MD       Signed, Candee Furbish, MD  05/07/2019 10:01 AM    [place]

## 2019-05-17 ENCOUNTER — Other Ambulatory Visit (HOSPITAL_COMMUNITY): Payer: No Typology Code available for payment source

## 2019-05-25 ENCOUNTER — Ambulatory Visit (HOSPITAL_COMMUNITY): Payer: Self-pay | Attending: Cardiology

## 2019-05-25 ENCOUNTER — Other Ambulatory Visit: Payer: Self-pay

## 2019-05-25 DIAGNOSIS — E782 Mixed hyperlipidemia: Secondary | ICD-10-CM | POA: Insufficient documentation

## 2019-05-25 DIAGNOSIS — I2510 Atherosclerotic heart disease of native coronary artery without angina pectoris: Secondary | ICD-10-CM | POA: Insufficient documentation

## 2019-05-25 DIAGNOSIS — Z951 Presence of aortocoronary bypass graft: Secondary | ICD-10-CM | POA: Insufficient documentation

## 2019-05-25 DIAGNOSIS — I255 Ischemic cardiomyopathy: Secondary | ICD-10-CM | POA: Insufficient documentation

## 2019-05-25 MED ORDER — PERFLUTREN LIPID MICROSPHERE
1.0000 mL | INTRAVENOUS | Status: AC | PRN
  Administered 2019-05-25: 3 mL via INTRAVENOUS

## 2020-03-03 ENCOUNTER — Other Ambulatory Visit: Payer: Self-pay | Admitting: Cardiology

## 2020-03-03 DIAGNOSIS — Z951 Presence of aortocoronary bypass graft: Secondary | ICD-10-CM

## 2020-03-03 DIAGNOSIS — I255 Ischemic cardiomyopathy: Secondary | ICD-10-CM

## 2020-03-03 DIAGNOSIS — E782 Mixed hyperlipidemia: Secondary | ICD-10-CM

## 2020-03-03 DIAGNOSIS — I2510 Atherosclerotic heart disease of native coronary artery without angina pectoris: Secondary | ICD-10-CM

## 2020-03-03 NOTE — Telephone Encounter (Signed)
rx refill

## 2020-05-06 ENCOUNTER — Other Ambulatory Visit: Payer: Self-pay

## 2020-05-06 ENCOUNTER — Encounter: Payer: Self-pay | Admitting: Cardiology

## 2020-05-06 ENCOUNTER — Ambulatory Visit (INDEPENDENT_AMBULATORY_CARE_PROVIDER_SITE_OTHER): Payer: No Typology Code available for payment source | Admitting: Cardiology

## 2020-05-06 VITALS — BP 130/90 | HR 65 | Ht 68.0 in | Wt 237.6 lb

## 2020-05-06 DIAGNOSIS — I255 Ischemic cardiomyopathy: Secondary | ICD-10-CM

## 2020-05-06 DIAGNOSIS — E782 Mixed hyperlipidemia: Secondary | ICD-10-CM

## 2020-05-06 DIAGNOSIS — Z951 Presence of aortocoronary bypass graft: Secondary | ICD-10-CM

## 2020-05-06 DIAGNOSIS — I2510 Atherosclerotic heart disease of native coronary artery without angina pectoris: Secondary | ICD-10-CM

## 2020-05-06 MED ORDER — METOPROLOL SUCCINATE ER 100 MG PO TB24: 100.0000 mg | ORAL_TABLET | Freq: Every day | ORAL | 3 refills | Status: DC

## 2020-05-06 MED ORDER — ATORVASTATIN CALCIUM 80 MG PO TABS: ORAL_TABLET | ORAL | 3 refills | Status: DC

## 2020-05-06 MED ORDER — LISINOPRIL 20 MG PO TABS: 20.0000 mg | ORAL_TABLET | Freq: Every day | ORAL | 3 refills | Status: DC

## 2020-05-06 NOTE — Progress Notes (Signed)
Cardiology Office Note:    Date:  05/06/2020   ID:  [name] [name], DOB Aug 13, 1980, MRN [id number]  PCP:  Claiborne Rigg, NP  [facility] Cardiologist:  Donato Schultz, MD  [facility] Electrophysiologist:  None   Referring MD: Claiborne Rigg, NP     History of Present Illness:    [name] [name] is a 40 y.o. male here for the follow-up of coronary artery disease.  Bypass surgery/CABG 2018- Dr. Maren Beach, prior EF 40%  He is a Education administrator by trade feels well.   No anginal symptoms.  Occasional numbness in his leg but this is improved.  This is at the vein harvest site.  He states that he now feels some numbness on his anterior tibial region close to his ankle.  Overall he is doing quite well.  No trouble with his painting.  No chest pain no fevers no chills no nausea no vomiting.    Past Medical History:  Diagnosis Date  . Essential hypertension, benign   . Hyperlipidemia     Past Surgical History:  Procedure Laterality Date  . CORONARY ARTERY BYPASS GRAFT N/A 02/15/2017   Procedure: CORONARY ARTERY BYPASS GRAFTING (CABG), ON PUMP, TIMES FOUR, USING LEFT INTERNAL MAMMARY ARTERY AND ENDOSCOPICALLY HARVESTED RIGHT GREATER SAPHENOUS VEIN;  Surgeon: Kerin Perna, MD;  Location: [facility] OR;  Service: Open Heart Surgery;  Laterality: N/A;  LIMA to LAD, SVG to DIAGONAL, SVG to RAMUS INTERMEDIATE, SVG to RCA  . LEFT HEART CATH AND CORONARY ANGIOGRAPHY N/A 02/10/2017   Procedure: LEFT HEART CATH AND CORONARY ANGIOGRAPHY;  Surgeon: Yvonne Kendall, MD;  [facility] INVASIVE CV LAB;  Service: Cardiovascular;  Laterality: N/A;  . TEE WITHOUT CARDIOVERSION N/A 02/15/2017   Procedure: TRANSESOPHAGEAL ECHOCARDIOGRAM (TEE);  Surgeon: Donata Clay, Theron Arista, MD;  Location: [facility] OR;  Service: Open Heart Surgery;  Laterality: N/A;    Current Medications: Current Meds  Medication Sig  . aspirin EC 81 MG tablet Take 1 tablet (81 mg total) by mouth daily.  . [DISCONTINUED]  atorvastatin (LIPITOR) 80 MG tablet TAKE ONE TABLET BY MOUTH DAILY AT 6 PM.  . [DISCONTINUED] lisinopril (ZESTRIL) 20 MG tablet Take 1 tablet by mouth once daily  . [DISCONTINUED] metoprolol succinate (TOPROL-XL) 100 MG 24 hr tablet Take 1 tablet (100 mg total) by mouth daily. Take with or immediately following a meal.     Allergies:   Patient has no known allergies.   Social History   Socioeconomic History  . Marital status: Married    Spouse name: Not on file  . Number of children: Not on file  . Years of education: Not on file  . Highest education level: Not on file  Occupational History  . Not on file  Tobacco Use  . Smoking status: Never Smoker  . Smokeless tobacco: Never Used  Substance and Sexual Activity  . Alcohol use: Yes  . Drug use: No  . Sexual activity: Not on file  Other Topics Concern  . Not on file  Social History Narrative  . Not on file   Social Determinants of Health   Financial Resource Strain: Not on file  Food Insecurity: Not on file  Transportation Needs: Not on file  Physical Activity: Not on file  Stress: Not on file  Social Connections: Not on file     Family History: The patient's family history includes Diabetes in his brother and sister.  ROS:   Please see the history of present illness.     All  other systems reviewed and are negative.  EKGs/Labs/Other Studies Reviewed:    The following studies were reviewed today:   CABG 02/15/17 PROCEDURE:Procedure(s):  CORONARY ARTERY BYPASS GRAFTINGx 4 -LIMA to LAD -SVG to DIAGONAL -SVG to RAMUS INTERMEDIATE -SVG to RCA  ENDOSCOPIC HARVEST GREATER SAPHENOUS VEIN -Right Leg  TRANSESOPHAGEAL ECHOCARDIOGRAM (TEE) (N/A)  Echo 03/22/17 Study Conclusions  - Left ventricle: The cavity size was mildly dilated. Wall thickness was normal. The estimated ejection fraction was 40%. Septal and inferior hypokinesis. Apical akinesis. Features are consistent with a pseudonormal  left ventricular filling pattern, with concomitant abnormal relaxation and increased filling pressure (grade 2 diastolic dysfunction). - Aortic valve: There was no stenosis. - Mitral valve: There was trivial regurgitation. - Right ventricle: The cavity size was normal. Systolic function was mildly reduced. - Tricuspid valve: Peak RV-RA gradient (S): 22 mm Hg. - Pulmonary arteries: PA peak pressure: 25 mm Hg (S). - Inferior vena cava: The vessel was normal in size. The respirophasic diameter changes were in the normal range (>= 50%), consistent with normal central venous pressure.  05/25/2019 ECHO:  1. Left ventricular ejection fraction, by visual estimation, is 50 to  55%. The left ventricle has low normal function. There is mildly increased  left ventricular hypertrophy.  2. Definity contrast agent was given IV to delineate the left ventricular  endocardial borders.  3. Abnormal septal motion consistent with post-operative status.  4. Moderately dilated left ventricular internal cavity size.  5. The left ventricle demonstrates regional wall motion abnormalities.  6. Moderate hypokinesis of the left ventricular, apical septal wall and  inferior wall.  7. LV thrombus excluded with use of contrast.  8. Global right ventricle has moderately reduced systolic function.The  right ventricular size is mildly enlarged. Right vetricular wall thickness  was not assessed.  9. Left atrial size was mild-moderately dilated.  10. Right atrial size was mildly dilated.  11. The mitral valve is normal in structure. Mild mitral valve  regurgitation.  12. The tricuspid valve is normal in structure. Tricuspid valve  regurgitation is trivial.  13. The aortic valve is tricuspid. Aortic valve regurgitation is not  visualized. No evidence of aortic valve sclerosis or stenosis.  14. The pulmonic valve was grossly normal. Pulmonic valve regurgitation is  trivial.  15. Mildly elevated  pulmonary artery systolic pressure.  16. LV EF appears slightly improved from prior. Distal inferior/septal  segments are hypokinetic with akinetic apex. No evidence of LV thrombus  EKG:  EKG is  ordered today.  The ekg ordered today demonstrates sinus rhythm T wave inversion 1 and aVL  Recent Labs: No results found for requested labs within last 8760 hours.  Recent Lipid Panel    Component Value Date/Time   CHOL 125 05/07/2019 0952   TRIG 91 05/07/2019 0952   HDL 42 05/07/2019 0952   CHOLHDL 3.0 05/07/2019 0952   LDLCALC 66 05/07/2019 0952     Risk Assessment/Calculations:       Physical Exam:    VS:  BP 130/90 (BP Location: Left Arm, Patient Position: Sitting, Cuff Size: Normal)   Pulse 65   Ht 5\' 8"  (1.727 m)   Wt 237 lb 9.6 oz (107.8 kg)   SpO2 98%   BMI 36.13 kg/m     Wt Readings from Last 3 Encounters:  05/06/20 237 lb 9.6 oz (107.8 kg)  05/07/19 232 lb (105.2 kg)  04/27/18 220 lb (99.8 kg)     GEN:  Well nourished, well developed in no acute  distress HEENT: Normal NECK: No JVD; No carotid bruits LYMPHATICS: No lymphadenopathy CARDIAC: RRR, no murmurs, rubs, gallops RESPIRATORY:  Clear to auscultation without rales, wheezing or rhonchi  ABDOMEN: Soft, non-tender, non-distended MUSCULOSKELETAL:  No edema; No deformity  SKIN: Warm and dry NEUROLOGIC:  Alert and oriented x 3 PSYCHIATRIC:  Normal affect   ASSESSMENT:    1. CAD in native artery   2. S/P CABG x 4   3. Ischemic cardiomyopathy   4. Mixed hyperlipidemia    PLAN:    In order of problems listed above:  Coronary artery disease - CABG 2018 continue with aggressive secondary risk factor prevention.  Continue with aspirin, statin.  LDL 66, excellent.  Hemoglobin A1c 5.8 hemoglobin 12.2 creatinine 0.94 ALT 28  Ischemic cardiomyopathy - Improved EF to 50 to 55% on echocardiogram 05/25/2019.  Excellent.  T wave inversion noted in 1 and aVL on ECG.  Taking his medications without any  myalgias.  Essential hypertension - Prior visit we increased his lisinopril.  Better controlled today.  Diastolic is still slightly elevated.  Continue with diet and exercise.  Also on metoprolol.       Medication Adjustments/Labs and Tests Ordered: Current medicines are reviewed at length with the patient today.  Concerns regarding medicines are outlined above.  Orders Placed This Encounter  Procedures  . EKG 12-Lead   Meds ordered this encounter  Medications  . metoprolol succinate (TOPROL-XL) 100 MG 24 hr tablet    Sig: Take 1 tablet (100 mg total) by mouth daily. Take with or immediately following a meal.    Dispense:  90 tablet    Refill:  3  . lisinopril (ZESTRIL) 20 MG tablet    Sig: Take 1 tablet (20 mg total) by mouth daily.    Dispense:  90 tablet    Refill:  3  . atorvastatin (LIPITOR) 80 MG tablet    Sig: TAKE ONE TABLET BY MOUTH DAILY AT 6 PM.    Dispense:  90 tablet    Refill:  3    Patient Instructions  Medication Instructions:  The current medical regimen is effective;  continue present plan and medications.  *If you need a refill on your cardiac medications before your next appointment, please call your pharmacy*  Follow-Up: At [facility], you and your health needs are our priority.  As part of our continuing mission to provide you with exceptional heart care, we have created designated Provider Care Teams.  These Care Teams include your primary Cardiologist (physician) and Advanced Practice Providers (APPs -  Physician Assistants and Nurse Practitioners) who all work together to provide you with the care you need, when you need it.  We recommend signing up for the patient portal called "MyChart".  Sign up information is provided on this After Visit Summary.  MyChart is used to connect with patients for Virtual Visits (Telemedicine).  Patients are able to view lab/test results, encounter notes, upcoming appointments, etc.  Non-urgent messages can be sent to  your provider as well.   To learn more about what you can do with MyChart, go to [email or web address].    Your next appointment:   12 month(s)  The format for your next appointment:   In Person  Provider:   Donato Schultz, MD   Thank you for choosing [facility]!!        Signed, Donato Schultz, MD  05/06/2020 6:19 PM    [facility]

## 2020-05-06 NOTE — Patient Instructions (Signed)
Medication Instructions:  The current medical regimen is effective;  continue present plan and medications.  *If you need a refill on your cardiac medications before your next appointment, please call your pharmacy*  Follow-Up: At [facility], you and your health needs are our priority.  As part of our continuing mission to provide you with exceptional heart care, we have created designated Provider Care Teams.  These Care Teams include your primary Cardiologist (physician) and Advanced Practice Providers (APPs -  Physician Assistants and Nurse Practitioners) who all work together to provide you with the care you need, when you need it.  We recommend signing up for the patient portal called "MyChart".  Sign up information is provided on this After Visit Summary.  MyChart is used to connect with patients for Virtual Visits (Telemedicine).  Patients are able to view lab/test results, encounter notes, upcoming appointments, etc.  Non-urgent messages can be sent to your provider as well.   To learn more about what you can do with MyChart, go to [email or web address].    Your next appointment:   12 month(s)  The format for your next appointment:   In Person  Provider:   Mark Skains, MD   Thank you for choosing [facility]!!      

## 2021-05-07 ENCOUNTER — Other Ambulatory Visit: Payer: Self-pay | Admitting: Cardiology

## 2021-05-07 DIAGNOSIS — I2510 Atherosclerotic heart disease of native coronary artery without angina pectoris: Secondary | ICD-10-CM

## 2021-05-07 DIAGNOSIS — I255 Ischemic cardiomyopathy: Secondary | ICD-10-CM

## 2021-05-07 DIAGNOSIS — E782 Mixed hyperlipidemia: Secondary | ICD-10-CM

## 2021-05-07 DIAGNOSIS — Z951 Presence of aortocoronary bypass graft: Secondary | ICD-10-CM

## 2021-05-21 ENCOUNTER — Encounter: Payer: Self-pay | Admitting: Cardiology

## 2021-05-21 ENCOUNTER — Other Ambulatory Visit: Payer: Self-pay

## 2021-05-21 ENCOUNTER — Ambulatory Visit (INDEPENDENT_AMBULATORY_CARE_PROVIDER_SITE_OTHER): Payer: No Typology Code available for payment source | Admitting: Cardiology

## 2021-05-21 DIAGNOSIS — I25709 Atherosclerosis of coronary artery bypass graft(s), unspecified, with unspecified angina pectoris: Secondary | ICD-10-CM | POA: Insufficient documentation

## 2021-05-21 DIAGNOSIS — I517 Cardiomegaly: Secondary | ICD-10-CM

## 2021-05-21 DIAGNOSIS — I255 Ischemic cardiomyopathy: Secondary | ICD-10-CM | POA: Insufficient documentation

## 2021-05-21 DIAGNOSIS — E782 Mixed hyperlipidemia: Secondary | ICD-10-CM

## 2021-05-21 DIAGNOSIS — I10 Essential (primary) hypertension: Secondary | ICD-10-CM

## 2021-05-21 DIAGNOSIS — I2510 Atherosclerotic heart disease of native coronary artery without angina pectoris: Secondary | ICD-10-CM

## 2021-05-21 DIAGNOSIS — Z951 Presence of aortocoronary bypass graft: Secondary | ICD-10-CM

## 2021-05-21 MED ORDER — LISINOPRIL 20 MG PO TABS: 20.0000 mg | ORAL_TABLET | Freq: Every day | ORAL | 3 refills | Status: DC

## 2021-05-21 MED ORDER — METOPROLOL SUCCINATE ER 100 MG PO TB24: ORAL_TABLET | ORAL | 3 refills | Status: DC

## 2021-05-21 MED ORDER — ATORVASTATIN CALCIUM 80 MG PO TABS: ORAL_TABLET | ORAL | 3 refills | Status: DC

## 2021-05-21 NOTE — Assessment & Plan Note (Signed)
Blood pressure mildly elevated today.  He will continue to check this.  Continue with metoprolol lisinopril.

## 2021-05-21 NOTE — Assessment & Plan Note (Signed)
CABG in 2018.  Continue with good overall secondary risk factor prevention.

## 2021-05-21 NOTE — Progress Notes (Signed)
Cardiology Office Note:    Date:  05/21/2021   ID:  [name] [name], DOB 08-25-1980, MRN [id number]  PCP:  Claiborne Rigg, NP  [facility] Cardiologist:  Donato Schultz, MD  [facility] Electrophysiologist:  None   Referring MD: Claiborne Rigg, NP    History of Present Illness:    [name] [name] is a 41 y.o. male here for the follow-up of coronary artery disease, hypertension, and hyperlipidemia.  Bypass surgery/CABG 2018- Dr. Maren Beach, prior EF 40%  He is a Education administrator by trade.   At his last appointment he was feeling well with no anginal symptoms.  Occasional numbness in his leg but this had improved.  This was at the vein harvest site.  He also reported feeling some numbness on his anterior tibial region close to his ankle. No trouble with his painting.   Today: He is accompanied by an interpreter. Overall, he is feeling well with no new cardiovascular symptoms.  In clinic his blood pressure is slightly elevated at 140/90.  He denies any palpitations, chest pain, or shortness of breath. No lightheadedness, headaches, syncope, orthopnea, PND, lower extremity edema or exertional symptoms.   Past Medical History:  Diagnosis Date   Essential hypertension, benign    Hyperlipidemia     Past Surgical History:  Procedure Laterality Date   CORONARY ARTERY BYPASS GRAFT N/A 02/15/2017   Procedure: CORONARY ARTERY BYPASS GRAFTING (CABG), ON PUMP, TIMES FOUR, USING LEFT INTERNAL MAMMARY ARTERY AND ENDOSCOPICALLY HARVESTED RIGHT GREATER SAPHENOUS VEIN;  Surgeon: Kerin Perna, MD;  Location: [facility] OR;  Service: Open Heart Surgery;  Laterality: N/A;  LIMA to LAD, SVG to DIAGONAL, SVG to RAMUS INTERMEDIATE, SVG to RCA   LEFT HEART CATH AND CORONARY ANGIOGRAPHY N/A 02/10/2017   Procedure: LEFT HEART CATH AND CORONARY ANGIOGRAPHY;  Surgeon: Yvonne Kendall, MD;  Location: [facility] INVASIVE CV LAB;  Service: Cardiovascular;  Laterality: N/A;   TEE WITHOUT CARDIOVERSION N/A  02/15/2017   Procedure: TRANSESOPHAGEAL ECHOCARDIOGRAM (TEE);  Surgeon: Donata Clay, Theron Arista, MD;  Location: [facility] OR;  Service: Open Heart Surgery;  Laterality: N/A;    Current Medications: Current Meds  Medication Sig   aspirin EC 81 MG tablet Take 1 tablet (81 mg total) by mouth daily.   [DISCONTINUED] atorvastatin (LIPITOR) 80 MG tablet TAKE  TABLET BY MOUTH ONCE DAILY AT 6 PM   [DISCONTINUED] lisinopril (ZESTRIL) 20 MG tablet Take 1 tablet by mouth once daily   [DISCONTINUED] metoprolol succinate (TOPROL-XL) 100 MG 24 hr tablet TAKE 1 TABLET BY MOUTH ONCE DAILY TAKE  WITH  OR  IMMEDIATELY  FOLLOWING  A  MEAL     Allergies:   Patient has no known allergies.   Social History   Socioeconomic History   Marital status: Married    Spouse name: Not on file   Number of children: Not on file   Years of education: Not on file   Highest education level: Not on file  Occupational History   Not on file  Tobacco Use   Smoking status: Never   Smokeless tobacco: Never  Substance and Sexual Activity   Alcohol use: Yes   Drug use: No   Sexual activity: Not on file  Other Topics Concern   Not on file  Social History Narrative   Not on file   Social Determinants of Health   Financial Resource Strain: Not on file  Food Insecurity: Not on file  Transportation Needs: Not on file  Physical Activity: Not on file  Stress:  Not on file  Social Connections: Not on file     Family History: The patient's family history includes Diabetes in his brother and sister.  ROS:   Please see the history of present illness.    SR 71 T wave inversion in multiple leads, no change from prior. All other systems reviewed and are negative.  EKGs/Labs/Other Studies Reviewed:    The following studies were reviewed today:  05/25/2019 ECHO:  1. Left ventricular ejection fraction, by visual estimation, is 50 to  55%. The left ventricle has low normal function. There is mildly increased  left ventricular  hypertrophy.   2. Definity contrast agent was given IV to delineate the left ventricular  endocardial borders.   3. Abnormal septal motion consistent with post-operative status.   4. Moderately dilated left ventricular internal cavity size.   5. The left ventricle demonstrates regional wall motion abnormalities.   6. Moderate hypokinesis of the left ventricular, apical septal wall and  inferior wall.   7. LV thrombus excluded with use of contrast.   8. Global right ventricle has moderately reduced systolic function.The  right ventricular size is mildly enlarged. Right vetricular wall thickness  was not assessed.   9. Left atrial size was mild-moderately dilated.  10. Right atrial size was mildly dilated.  11. The mitral valve is normal in structure. Mild mitral valve  regurgitation.  12. The tricuspid valve is normal in structure. Tricuspid valve  regurgitation is trivial.  13. The aortic valve is tricuspid. Aortic valve regurgitation is not  visualized. No evidence of aortic valve sclerosis or stenosis.  14. The pulmonic valve was grossly normal. Pulmonic valve regurgitation is  trivial.  15. Mildly elevated pulmonary artery systolic pressure.  16. LV EF appears slightly improved from prior. Distal inferior/septal  segments are hypokinetic with akinetic apex. No evidence of LV thrombus  Echo 03/22/17  Study Conclusions   - Left ventricle: The cavity size was mildly dilated. Wall   thickness was normal. The estimated ejection fraction was 40%.   Septal and inferior hypokinesis. Apical akinesis. Features are   consistent with a pseudonormal left ventricular filling pattern,   with concomitant abnormal relaxation and increased filling   pressure (grade 2 diastolic dysfunction). - Aortic valve: There was no stenosis. - Mitral valve: There was trivial regurgitation. - Right ventricle: The cavity size was normal. Systolic function   was mildly reduced. - Tricuspid valve: Peak RV-RA  gradient (S): 22 mm Hg. - Pulmonary arteries: PA peak pressure: 25 mm Hg (S). - Inferior vena cava: The vessel was normal in size. The   respirophasic diameter changes were in the normal range (>= 50%),   consistent with normal central venous pressure.  CABG 02/15/17 PROCEDURE:  Procedure(s):   CORONARY ARTERY BYPASS GRAFTING x 4 -LIMA to LAD -SVG to DIAGONAL -SVG to RAMUS INTERMEDIATE -SVG to RCA   ENDOSCOPIC HARVEST GREATER SAPHENOUS VEIN -Right Leg   TRANSESOPHAGEAL ECHOCARDIOGRAM (TEE) (N/A)   Left Heart Cath 02/10/2017: Conclusions: Severe three vessel coronary artery disease, as detailed below. Moderately reduced left ventricular ejection fraction with global hypokinesis. Mildly elevated left ventricular filling pressure.   Recommendations: Admit to 2 Heart. IV NTG for BP control and anti-anginal therapy (though patient is currently chest pain free). Restart heparin infusion 2 hours after TR band has been deflated. Cardiac surgery consultation has been called (discussed with Dr. Donata Clay). Statin therapy and metoprolol, as previously ordered. Follow-up echo.  Diagnostic Dominance: Right    EKG:  EKG  is personally reviewed and interpreted. 05/21/2021: Sinus rhythm. Rate 71 bpm. T wave inversion in multiple leads, no change from prior. 05/06/2020: sinus rhythm T wave inversion 1 and aVL  Recent Labs: No results found for requested labs within last 8760 hours.   Recent Lipid Panel    Component Value Date/Time   CHOL 125 05/07/2019 0952   TRIG 91 05/07/2019 0952   HDL 42 05/07/2019 0952   CHOLHDL 3.0 05/07/2019 0952   LDLCALC 66 05/07/2019 0952     Risk Assessment/Calculations:       Physical Exam:    VS:  BP 140/90 (BP Location: Left Arm, Patient Position: Sitting, Cuff Size: Normal)    Pulse 71    Ht 5\' 8"  (1.727 m)    Wt 244 lb (110.7 kg)    SpO2 96%    BMI 37.10 kg/m     Wt Readings from Last 3 Encounters:  05/21/21 244 lb (110.7 kg)  05/06/20  237 lb 9.6 oz (107.8 kg)  05/07/19 232 lb (105.2 kg)     GEN:  Well nourished, well developed in no acute distress HEENT: Normal NECK: No JVD; No carotid bruits LYMPHATICS: No lymphadenopathy CARDIAC: RRR, no murmurs, rubs, gallops RESPIRATORY:  Clear to auscultation without rales, wheezing or rhonchi  ABDOMEN: Soft, non-tender, non-distended MUSCULOSKELETAL:  No edema; No deformity  SKIN: Warm and dry NEUROLOGIC:  Alert and oriented x 3 PSYCHIATRIC:  Normal affect   ASSESSMENT:    1. Coronary artery disease involving coronary bypass graft of native heart with angina pectoris (HCC)   2. CAD in native artery   3. S/P CABG x 4   4. Ischemic cardiomyopathy   5. Mixed hyperlipidemia   6. Essential hypertension, benign   7. LVH (left ventricular hypertrophy)     PLAN:    In order of problems listed above:  Coronary artery disease involving coronary bypass graft of native heart with angina pectoris (HCC) CABG 2018 very well.  No anginal symptoms.  Continuing with goal-directed medical therapy includes statin aspirin beta-blocker ACE inhibitor.  EF has improved.  Essential hypertension, benign Blood pressure mildly elevated today.  He will continue to check this.  Continue with metoprolol lisinopril.  S/P CABG x 4 CABG in 2018.  Continue with good overall secondary risk factor prevention.  LVH (left ventricular hypertrophy) T wave inversions noted on EKG especially 1 and aVL.  This is chronic.  EF has improved to 50 to 55%  Ischemic cardiomyopathy EF has improved from 40% up to 55% on echocardiogram in 2021.  Excellent.  No changes made.  Medical therapy     Follow-up: 1 year.     Medication Adjustments/Labs and Tests Ordered: Current medicines are reviewed at length with the patient today.  Concerns regarding medicines are outlined above.   Orders Placed This Encounter  Procedures   EKG 12-Lead   Meds ordered this encounter  Medications   metoprolol succinate  (TOPROL-XL) 100 MG 24 hr tablet    Sig: TAKE 1 TABLET BY MOUTH ONCE DAILY TAKE  WITH  OR  IMMEDIATELY  FOLLOWING  A  MEAL    Dispense:  90 tablet    Refill:  3   lisinopril (ZESTRIL) 20 MG tablet    Sig: Take 1 tablet (20 mg total) by mouth daily.    Dispense:  90 tablet    Refill:  3   atorvastatin (LIPITOR) 80 MG tablet    Sig: TAKE  TABLET BY MOUTH ONCE DAILY AT 6  PM    Dispense:  90 tablet    Refill:  3   Patient Instructions  Medication Instructions:  The current medical regimen is effective;  continue present plan and medications.  *If you need a refill on your cardiac medications before your next appointment, please call your pharmacy  Follow-Up: At [facility], you and your health needs are our priority.  As part of our continuing mission to provide you with exceptional heart care, we have created designated Provider Care Teams.  These Care Teams include your primary Cardiologist (physician) and Advanced Practice Providers (APPs -  Physician Assistants and Nurse Practitioners) who all work together to provide you with the care you need, when you need it.  We recommend signing up for the patient portal called "MyChart".  Sign up information is provided on this After Visit Summary.  MyChart is used to connect with patients for Virtual Visits (Telemedicine).  Patients are able to view lab/test results, encounter notes, upcoming appointments, etc.  Non-urgent messages can be sent to your provider as well.   To learn more about what you can do with MyChart, go to [email or web address].    Your next appointment:   1 year(s)  The format for your next appointment:   In Person  Provider:   Donato Schultz, MD     Thank you for choosing [facility]!!      I,Mathew Stumpf,acting as a scribe for Donato Schultz, MD.,have documented all relevant documentation on the behalf of Donato Schultz, MD,as directed by  Donato Schultz, MD while in the presence of Donato Schultz, MD.  I, Donato Schultz, MD, have reviewed all documentation for this visit. The documentation on 05/21/21 for the exam, diagnosis, procedures, and orders are all accurate and complete.   Signed, Donato Schultz, MD  05/21/2021 4:32 PM    [facility]

## 2021-05-21 NOTE — Assessment & Plan Note (Signed)
T wave inversions noted on EKG especially 1 and aVL.  This is chronic.  EF has improved to 50 to 55%

## 2021-05-21 NOTE — Assessment & Plan Note (Signed)
CABG 2018 very well.  No anginal symptoms.  Continuing with goal-directed medical therapy includes statin aspirin beta-blocker ACE inhibitor.  EF has improved.

## 2021-05-21 NOTE — Assessment & Plan Note (Signed)
EF has improved from 40% up to 55% on echocardiogram in 2021.  Excellent.  No changes made.  Medical therapy

## 2021-05-21 NOTE — Patient Instructions (Signed)
Medication Instructions:  The current medical regimen is effective;  continue present plan and medications.  *If you need a refill on your cardiac medications before your next appointment, please call your pharmacy*  Follow-Up: At [facility], you and your health needs are our priority.  As part of our continuing mission to provide you with exceptional heart care, we have created designated Provider Care Teams.  These Care Teams include your primary Cardiologist (physician) and Advanced Practice Providers (APPs -  Physician Assistants and Nurse Practitioners) who all work together to provide you with the care you need, when you need it.  We recommend signing up for the patient portal called "MyChart".  Sign up information is provided on this After Visit Summary.  MyChart is used to connect with patients for Virtual Visits (Telemedicine).  Patients are able to view lab/test results, encounter notes, upcoming appointments, etc.  Non-urgent messages can be sent to your provider as well.   To learn more about what you can do with MyChart, go to [email or web address].    Your next appointment:   1 year(s)  The format for your next appointment:   In Person  Provider:   Mark Skains, MD   Thank you for choosing [facility]!!    

## 2022-05-31 ENCOUNTER — Other Ambulatory Visit: Payer: Self-pay | Admitting: Cardiology

## 2022-05-31 MED ORDER — LISINOPRIL 20 MG PO TABS: 20.0000 mg | ORAL_TABLET | Freq: Every day | ORAL | 0 refills | Status: DC

## 2022-05-31 MED ORDER — ATORVASTATIN CALCIUM 80 MG PO TABS: ORAL_TABLET | ORAL | 0 refills | Status: DC

## 2022-07-09 ENCOUNTER — Other Ambulatory Visit: Payer: Self-pay | Admitting: Cardiology

## 2022-08-06 ENCOUNTER — Ambulatory Visit: Payer: Self-pay | Attending: Cardiology | Admitting: Cardiology

## 2022-08-06 ENCOUNTER — Encounter: Payer: Self-pay | Admitting: Cardiology

## 2022-08-06 VITALS — BP 130/90 | HR 83 | Ht 68.0 in | Wt 242.0 lb

## 2022-08-06 DIAGNOSIS — I25709 Atherosclerosis of coronary artery bypass graft(s), unspecified, with unspecified angina pectoris: Secondary | ICD-10-CM

## 2022-08-06 DIAGNOSIS — E782 Mixed hyperlipidemia: Secondary | ICD-10-CM

## 2022-08-06 DIAGNOSIS — Z951 Presence of aortocoronary bypass graft: Secondary | ICD-10-CM

## 2022-08-06 MED ORDER — LISINOPRIL 20 MG PO TABS: 20.0000 mg | ORAL_TABLET | Freq: Every day | ORAL | 3 refills | Status: DC

## 2022-08-06 MED ORDER — ATORVASTATIN CALCIUM 80 MG PO TABS: ORAL_TABLET | ORAL | 3 refills | Status: DC

## 2022-08-06 MED ORDER — METOPROLOL SUCCINATE ER 100 MG PO TB24: ORAL_TABLET | ORAL | 3 refills | Status: DC

## 2022-08-06 NOTE — Progress Notes (Signed)
Cardiology Office Note:    Date:  08/06/2022   ID:  [name] [name], DOB 1980/12/11, MRN [id number]  PCP:  Claiborne Rigg, NP  [facility] Cardiologist:  Donato Schultz, MD  [facility] Electrophysiologist:  None   Referring MD: Claiborne Rigg, NP    History of Present Illness:    [name] [name] is a 42 y.o. male here for the follow-up of coronary artery disease, hypertension, and hyperlipidemia.  Bypass surgery/CABG 2018- Dr. Maren Beach, prior EF 40%  He is a Education administrator by trade.   At his last appointment he was feeling well with no anginal symptoms.  Occasional numbness in his leg but this had improved.  This was at the vein harvest site.  He also reported feeling some numbness on his anterior tibial region close to his ankle. No trouble with his painting.   Today: He is accompanied by an interpreter. Overall, he is feeling well with no new cardiovascular symptoms.  In clinic his blood pressure is slightly elevated at 140/90.  Numbness in his leg has improved.  No chest pain fevers chills nausea vomiting syncope bleeding.   Past Medical History:  Diagnosis Date   Essential hypertension, benign    Hyperlipidemia     Past Surgical History:  Procedure Laterality Date   CORONARY ARTERY BYPASS GRAFT N/A 02/15/2017   Procedure: CORONARY ARTERY BYPASS GRAFTING (CABG), ON PUMP, TIMES FOUR, USING LEFT INTERNAL MAMMARY ARTERY AND ENDOSCOPICALLY HARVESTED RIGHT GREATER SAPHENOUS VEIN;  Surgeon: Kerin Perna, MD;  Location: [facility] OR;  Service: Open Heart Surgery;  Laterality: N/A;  LIMA to LAD, SVG to DIAGONAL, SVG to RAMUS INTERMEDIATE, SVG to RCA   LEFT HEART CATH AND CORONARY ANGIOGRAPHY N/A 02/10/2017   Procedure: LEFT HEART CATH AND CORONARY ANGIOGRAPHY;  Surgeon: Yvonne Kendall, MD;  Location: [facility] INVASIVE CV LAB;  Service: Cardiovascular;  Laterality: N/A;   TEE WITHOUT CARDIOVERSION N/A 02/15/2017   Procedure: TRANSESOPHAGEAL ECHOCARDIOGRAM (TEE);   Surgeon: Donata Clay, Theron Arista, MD;  Location: [facility] OR;  Service: Open Heart Surgery;  Laterality: N/A;    Current Medications: Current Meds  Medication Sig   aspirin EC 81 MG tablet Take 1 tablet (81 mg total) by mouth daily.   [DISCONTINUED] atorvastatin (LIPITOR) 80 MG tablet TAKE ONE TABLET BY MOUTH ONCE DAILY AT 6 PM.   [DISCONTINUED] lisinopril (ZESTRIL) 20 MG tablet Take 1 tablet (20 mg total) by mouth daily.   [DISCONTINUED] metoprolol succinate (TOPROL-XL) 100 MG 24 hr tablet TAKE 1 TABLET BY MOUTH ONCE DAILY TAKE  WITH  OR  IMMEDIATELY  FOLLOWING  A  MEAL.     Allergies:   Patient has no known allergies.   Social History   Socioeconomic History   Marital status: Married    Spouse name: Not on file   Number of children: Not on file   Years of education: Not on file   Highest education level: Not on file  Occupational History   Not on file  Tobacco Use   Smoking status: Never   Smokeless tobacco: Never  Substance and Sexual Activity   Alcohol use: Yes   Drug use: No   Sexual activity: Not on file  Other Topics Concern   Not on file  Social History Narrative   Not on file   Social Determinants of Health   Financial Resource Strain: Not on file  Food Insecurity: Not on file  Transportation Needs: Not on file  Physical Activity: Not on file  Stress: Not on file  Social Connections: Not on file     Family History: The patient's family history includes Diabetes in his brother and sister.  ROS:   Please see the history of present illness.    SR 71 T wave inversion in multiple leads, no change from prior. All other systems reviewed and are negative.  EKGs/Labs/Other Studies Reviewed:    The following studies were reviewed today:  05/25/2019 ECHO:  1. Left ventricular ejection fraction, by visual estimation, is 50 to  55%. The left ventricle has low normal function. There is mildly increased  left ventricular hypertrophy.   2. Definity contrast agent was given IV  to delineate the left ventricular  endocardial borders.   3. Abnormal septal motion consistent with post-operative status.   4. Moderately dilated left ventricular internal cavity size.   5. The left ventricle demonstrates regional wall motion abnormalities.   6. Moderate hypokinesis of the left ventricular, apical septal wall and  inferior wall.   7. LV thrombus excluded with use of contrast.   8. Global right ventricle has moderately reduced systolic function.The  right ventricular size is mildly enlarged. Right vetricular wall thickness  was not assessed.   9. Left atrial size was mild-moderately dilated.  10. Right atrial size was mildly dilated.  11. The mitral valve is normal in structure. Mild mitral valve  regurgitation.  12. The tricuspid valve is normal in structure. Tricuspid valve  regurgitation is trivial.  13. The aortic valve is tricuspid. Aortic valve regurgitation is not  visualized. No evidence of aortic valve sclerosis or stenosis.  14. The pulmonic valve was grossly normal. Pulmonic valve regurgitation is  trivial.  15. Mildly elevated pulmonary artery systolic pressure.  16. LV EF appears slightly improved from prior. Distal inferior/septal  segments are hypokinetic with akinetic apex. No evidence of LV thrombus  Echo 03/22/17  Study Conclusions   - Left ventricle: The cavity size was mildly dilated. Wall   thickness was normal. The estimated ejection fraction was 40%.   Septal and inferior hypokinesis. Apical akinesis. Features are   consistent with a pseudonormal left ventricular filling pattern,   with concomitant abnormal relaxation and increased filling   pressure (grade 2 diastolic dysfunction). - Aortic valve: There was no stenosis. - Mitral valve: There was trivial regurgitation. - Right ventricle: The cavity size was normal. Systolic function   was mildly reduced. - Tricuspid valve: Peak RV-RA gradient (S): 22 mm Hg. - Pulmonary arteries: PA peak  pressure: 25 mm Hg (S). - Inferior vena cava: The vessel was normal in size. The   respirophasic diameter changes were in the normal range (>= 50%),   consistent with normal central venous pressure.  CABG 02/15/17 PROCEDURE:  Procedure(s):   CORONARY ARTERY BYPASS GRAFTING x 4 -LIMA to LAD -SVG to DIAGONAL -SVG to RAMUS INTERMEDIATE -SVG to RCA   ENDOSCOPIC HARVEST GREATER SAPHENOUS VEIN -Right Leg   TRANSESOPHAGEAL ECHOCARDIOGRAM (TEE) (N/A)   Left Heart Cath 02/10/2017: Conclusions: Severe three vessel coronary artery disease, as detailed below. Moderately reduced left ventricular ejection fraction with global hypokinesis. Mildly elevated left ventricular filling pressure.   Recommendations: Admit to 2 Heart. IV NTG for BP control and anti-anginal therapy (though patient is currently chest pain free). Restart heparin infusion 2 hours after TR band has been deflated. Cardiac surgery consultation has been called (discussed with Dr. Donata Clay). Statin therapy and metoprolol, as previously ordered. Follow-up echo.  Diagnostic Dominance: Right    EKG:  EKG is personally reviewed and  interpreted. 08/06/22: Sinus rhythm 83 LVH nonspecific ST-T wave changes T wave inversion noted only lateral leads 05/21/2021: Sinus rhythm. Rate 71 bpm. T wave inversion in multiple leads, no change from prior. 05/06/2020: sinus rhythm T wave inversion 1 and aVL  Recent Labs: No results found for requested labs within last 365 days.   Recent Lipid Panel    Component Value Date/Time   CHOL 125 05/07/2019 0952   TRIG 91 05/07/2019 0952   HDL 42 05/07/2019 0952   CHOLHDL 3.0 05/07/2019 0952   LDLCALC 66 05/07/2019 0952     Risk Assessment/Calculations:       Physical Exam:    VS:  BP (!) 130/90 (BP Location: Left Arm, Patient Position: Sitting, Cuff Size: Normal)   Pulse 83   Ht 5\' 8"  (1.727 m)   Wt 242 lb (109.8 kg)   BMI 36.80 kg/m     Wt Readings from Last 3 Encounters:   08/06/22 242 lb (109.8 kg)  05/21/21 244 lb (110.7 kg)  05/06/20 237 lb 9.6 oz (107.8 kg)     GEN: Well nourished, well developed, in no acute distress HEENT: normal Neck: no JVD, carotid bruits, or masses Cardiac: RRR; no murmurs, rubs, or gallops,no edema  Respiratory:  clear to auscultation bilaterally, normal work of breathing GI: soft, nontender, nondistended, + BS MS: no deformity or atrophy Skin: warm and dry, no rash Neuro:  Alert and Oriented x 3, Strength and sensation are intact Psych: euthymic mood, full affect   ASSESSMENT:    1. Coronary artery disease involving coronary bypass graft of native heart with angina pectoris   2. S/P CABG x 4   3. Mixed hyperlipidemia      PLAN:    In order of problems listed above:   Coronary artery disease involving coronary bypass graft of native heart with angina pectoris (HCC) CABG 2018 very well.  No anginal symptoms.  Continuing with goal-directed medical therapy includes statin aspirin beta-blocker ACE inhibitor.  EF has improved. Stable no change.   Essential hypertension, benign Blood pressure mildly elevated today.  He will continue to check this.  Continue with metoprolol lisinopril.Stable no change.   S/P CABG x 4 CABG in 2018.  Continue with good overall secondary risk factor prevention.Stable no change.   LVH (left ventricular hypertrophy) T wave inversions noted on EKG especially 1 and aVL.  This is chronic.  EF has improved to 50 to 55%Stable no change.   Ischemic cardiomyopathy EF has improved from 40% up to 55% on echocardiogram in 2021.  Excellent.  No changes made.  Medical therapy. Stable no change.     Follow-up: 1 year.     Medication Adjustments/Labs and Tests Ordered: Current medicines are reviewed at length with the patient today.  Concerns regarding medicines are outlined above.   Orders Placed This Encounter  Procedures   EKG 12-Lead   Meds ordered this encounter  Medications    metoprolol succinate (TOPROL-XL) 100 MG 24 hr tablet    Sig: Take with or immediately following a meal.    Dispense:  90 tablet    Refill:  3   lisinopril (ZESTRIL) 20 MG tablet    Sig: Take 1 tablet (20 mg total) by mouth daily.    Dispense:  90 tablet    Refill:  3   atorvastatin (LIPITOR) 80 MG tablet    Sig: TAKE ONE TABLET BY MOUTH ONCE DAILY AT 6 PM.    Dispense:  90 tablet  Refill:  3   Patient Instructions  Medication Instructions:  The current medical regimen is effective;  continue present plan and medications.  *If you need a refill on your cardiac medications before your next appointment, please call your pharmacy*  Follow-Up: At [facility], you and your health needs are our priority.  As part of our continuing mission to provide you with exceptional heart care, we have created designated Provider Care Teams.  These Care Teams include your primary Cardiologist (physician) and Advanced Practice Providers (APPs -  Physician Assistants and Nurse Practitioners) who all work together to provide you with the care you need, when you need it.  We recommend signing up for the patient portal called "MyChart".  Sign up information is provided on this After Visit Summary.  MyChart is used to connect with patients for Virtual Visits (Telemedicine).  Patients are able to view lab/test results, encounter notes, upcoming appointments, etc.  Non-urgent messages can be sent to your provider as well.   To learn more about what you can do with MyChart, go to [email or web address].    Your next appointment:   1 year(s)  Provider:   Donato Schultz, MD       Signed, Donato Schultz, MD  08/06/2022 5:22 PM    [facility]

## 2022-08-06 NOTE — Patient Instructions (Signed)
Medication Instructions:  The current medical regimen is effective;  continue present plan and medications.  *If you need a refill on your cardiac medications before your next appointment, please call your pharmacy*  Follow-Up: At  HeartCare, you and your health needs are our priority.  As part of our continuing mission to provide you with exceptional heart care, we have created designated Provider Care Teams.  These Care Teams include your primary Cardiologist (physician) and Advanced Practice Providers (APPs -  Physician Assistants and Nurse Practitioners) who all work together to provide you with the care you need, when you need it.  We recommend signing up for the patient portal called "MyChart".  Sign up information is provided on this After Visit Summary.  MyChart is used to connect with patients for Virtual Visits (Telemedicine).  Patients are able to view lab/test results, encounter notes, upcoming appointments, etc.  Non-urgent messages can be sent to your provider as well.   To learn more about what you can do with MyChart, go to [email or web address].    Your next appointment:   1 year(s)  Provider:   Mark Skains, MD      

## 2022-08-09 ENCOUNTER — Telehealth: Payer: Self-pay | Admitting: Cardiology

## 2022-08-09 NOTE — Telephone Encounter (Signed)
Pt c/o medication issue:  1. Name of Medication:   metoprolol succinate (TOPROL-XL) 100 MG 24 hr tablet   2. How are you currently taking this medication (dosage and times per day)?   3. Are you having a reaction (difficulty breathing--STAT)?   4. What is your medication issue?   Caller stated they need clarification on the dosage for this medication.

## 2022-08-09 NOTE — Telephone Encounter (Signed)
Left message for [facility] - Metoprolol succinate 100 mg tablets - pt is to take 1 tablet daily.  Requested they call back if any further questions or concerns.

## 2023-08-09 ENCOUNTER — Encounter: Payer: Self-pay | Admitting: Cardiology

## 2023-08-09 ENCOUNTER — Ambulatory Visit: Payer: Self-pay | Attending: Cardiology | Admitting: Cardiology

## 2023-08-09 VITALS — BP 130/78 | HR 77 | Ht 68.0 in | Wt 246.8 lb

## 2023-08-09 DIAGNOSIS — Z79899 Other long term (current) drug therapy: Secondary | ICD-10-CM

## 2023-08-09 DIAGNOSIS — I25709 Atherosclerosis of coronary artery bypass graft(s), unspecified, with unspecified angina pectoris: Secondary | ICD-10-CM

## 2023-08-09 DIAGNOSIS — E782 Mixed hyperlipidemia: Secondary | ICD-10-CM

## 2023-08-09 LAB — CBC

## 2023-08-09 MED ORDER — LISINOPRIL 20 MG PO TABS: 20.0000 mg | ORAL_TABLET | Freq: Every day | ORAL | 3 refills | Status: AC

## 2023-08-09 MED ORDER — METOPROLOL SUCCINATE ER 100 MG PO TB24: 100.0000 mg | ORAL_TABLET | Freq: Every day | ORAL | 3 refills | Status: AC

## 2023-08-09 MED ORDER — ATORVASTATIN CALCIUM 80 MG PO TABS: ORAL_TABLET | ORAL | 3 refills | Status: AC

## 2023-08-09 NOTE — Patient Instructions (Signed)
 Medication Instructions:  The current medical regimen is effective;  continue present plan and medications.  *If you need a refill on your cardiac medications before your next appointment, please call your pharmacy*  Lab Work: Please have blood work today (CBC, CMP, Lipid)  If you have labs (blood work) drawn today and your tests are completely normal, you will receive your results only by: MyChart Message (if you have MyChart) OR A paper copy in the mail If you have any lab test that is abnormal or we need to change your treatment, we will call you to review the results.   Follow-Up: At [facility], you and your health needs are our priority.  As part of our continuing mission to provide you with exceptional heart care, our providers are all part of one team.  This team includes your primary Cardiologist (physician) and Advanced Practice Providers or APPs (Physician Assistants and Nurse Practitioners) who all work together to provide you with the care you need, when you need it.  Your next appointment:   1 year(s)  Provider:   One of our Advanced Practice Providers (APPs): Melita Springer, PA-C  Friddie Jetty, NP Lawana Pray, NP  Theotis Flake, PA-C Lovette Rud, PA-C  [place], PA-C Charles Connor, NP  Ervin Heath, PA-C [place], PA-C  Marlana Silvan, NP    Dayna Dunn, PA-C  Taylor Parcells, PA-C Madison Fountain, NP Marlyse Single, PA-C Callie Goodrich, PA-C  Katlyn West, NP Sheng Haley, PA-C  Evan Williams, PA-C Kathleen Johnson, PA-C Xika Zhao, NP      We recommend signing up for the patient portal called "MyChart".  Sign up information is provided on this After Visit Summary.  MyChart is used to connect with patients for Virtual Visits (Telemedicine).  Patients are able to view lab/test results, encounter notes, upcoming appointments, etc.  Non-urgent messages can be sent to your provider as well.   To learn more about what you can do with MyChart, go to  [email or web address].      1st Floor: - Lobby - Registration  - Pharmacy  - Lab - Cafe  2nd Floor: - PV Lab - Diagnostic Testing (echo, CT, nuclear med)  3rd Floor: - Vacant  4th Floor: - TCTS (cardiothoracic surgery) - AFib Clinic - Structural Heart Clinic - Vascular Surgery  - Vascular Ultrasound  5th Floor: - HeartCare Cardiology (general and EP) - Clinical Pharmacy for coumadin, hypertension, lipid, weight-loss medications, and med management appointments    Valet parking services will be available as well.

## 2023-08-09 NOTE — Progress Notes (Signed)
 Cardiology Office Note:  .   Date:  08/09/2023  ID:  Doyle Askew, DOB 1980/05/14, MRN [id number] PCP: Claiborne Rigg, NP   [facility] Cardiologist:  Donato Schultz, MD     History of Present Illness: .   [name] [name] is a 43 y.o. male Discussed the use of AI scribe software for clinical note transcription with the patient, who gave verbal consent to proceed.  History of Present Illness [name] [name] is a 43 year old male with coronary artery disease and ischemic cardiomyopathy who presents for follow-up after prior bypass surgery.  He underwent bypass surgery in 2018 due to coronary artery disease and ischemic cardiomyopathy, with an ejection fraction of 40% at that time. An echocardiogram in 2021 showed improvement with an ejection fraction of 55% and moderately dilated left ventricular size.  He is asymptomatic with no chest pain and stable breathing. 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  He is currently on a medication regimen including lisinopril 20 mg daily, aspirin 81 mg daily, atorvastatin 80 mg daily, and metoprolol XL 100 mg daily. His blood pressure is well-controlled at 130/78 mmHg.  Recent lab results show a creatinine level of 0.94, LDL of 66, hemoglobin of 12.2, and triglycerides of 91. T wave inversions have been noted on prior ECGs as well as the current one.       Studies Reviewed: Marland Kitchen   EKG Interpretation Date/Time:  Tuesday August 09 2023 09:09:41 EDT Ventricular Rate:  77 PR Interval:  178 QRS Duration:  86 QT Interval:  370 QTC Calculation: 418 R Axis:   17  Text Interpretation: Normal sinus rhythm Minimal voltage criteria for LVH, may be normal variant ( R in aVL ) Inferior infarct (cited on or before 10-Feb-2017) Anterolateral infarct , age undetermined When compared with ECG of 16-Feb-2017 07:15, T wave inversion in V5-6 are no longer present  Confirmed by Donato Schultz ([id number]) on 08/09/2023 9:12:52 AM    Results LABS Cr: 0.94 LDL: 66 Hb: 12.2 Triglycerides: 91  DIAGNOSTIC Echocardiogram: Ejection fraction 55%, moderately dilated left ventricular size (2021) ECG: T wave inversions (08/09/2023) Risk Assessment/Calculations:            Physical Exam:   VS:  BP 130/78   Pulse 77   Ht 5\' 8"  (1.727 m)   Wt 246 lb 12.8 oz (111.9 kg)   SpO2 98%   BMI 37.53 kg/m    Wt Readings from Last 3 Encounters:  08/09/23 246 lb 12.8 oz (111.9 kg)  08/06/22 242 lb (109.8 kg)  05/21/21 244 lb (110.7 kg)    GEN: Well nourished, well developed in no acute distress NECK: No JVD; No carotid bruits CARDIAC: RRR, no murmurs, no rubs, no gallops RESPIRATORY:  Clear to auscultation without rales, wheezing or rhonchi  ABDOMEN: Soft, non-tender, non-distended EXTREMITIES:  No edema; No deformity   ASSESSMENT AND PLAN: .    Assessment and Plan Assessment & Plan Coronary artery disease with ischemic cardiomyopathy Coronary artery disease with ischemic cardiomyopathy, status post CABG in 2018. EF improved from 40% in 2018 to 55% in 2021 with moderately dilated left ventricular size. Currently asymptomatic with no chest pain. T wave inversions on ECG persist. Current medications include aspirin, atorvastatin, lisinopril, and metoprolol, effectively managing his condition. Blood pressure is well controlled at 130/78 mmHg. - Continue aspirin 81 mg daily - Continue atorvastatin 80 mg daily - Continue lisinopril 20 mg daily - Continue metoprolol XL 100 mg  daily - Order CBC, CMP, and lipid panel to monitor kidney function and cholesterol levels  Essential hypertension Blood pressure is well controlled at 130/78 mmHg on lisinopril and metoprolol. - Continue lisinopril 20 mg daily - Continue metoprolol XL 100 mg daily  Mixed hyperlipidemia LDL cholesterol is well controlled at 66 mg/dL, and triglycerides are at 91 mg/dL. Atorvastatin is  effectively managing his lipid levels. - Continue atorvastatin 80 mg daily          Signed, Dorothye Gathers, MD

## 2023-08-10 ENCOUNTER — Other Ambulatory Visit: Payer: Self-pay | Admitting: Nurse Practitioner

## 2023-08-10 DIAGNOSIS — E782 Mixed hyperlipidemia: Secondary | ICD-10-CM

## 2023-08-10 LAB — COMPREHENSIVE METABOLIC PANEL WITH GFR
ALT: 24 IU/L (ref 0–44)
AST: 17 IU/L (ref 0–40)
Albumin: 4.5 g/dL (ref 4.1–5.1)
Alkaline Phosphatase: 77 IU/L (ref 44–121)
BUN/Creatinine Ratio: 15 (ref 9–20)
BUN: 13 mg/dL (ref 6–24)
Bilirubin Total: 0.6 mg/dL (ref 0.0–1.2)
CO2: 22 mmol/L (ref 20–29)
Calcium: 9 mg/dL (ref 8.7–10.2)
Chloride: 102 mmol/L (ref 96–106)
Creatinine, Ser: 0.86 mg/dL (ref 0.76–1.27)
Globulin, Total: 2.6 g/dL (ref 1.5–4.5)
Glucose: 92 mg/dL (ref 70–99)
Potassium: 4.7 mmol/L (ref 3.5–5.2)
Sodium: 138 mmol/L (ref 134–144)
Total Protein: 7.1 g/dL (ref 6.0–8.5)
eGFR: 110 mL/min/{1.73_m2} (ref >59)

## 2023-08-10 LAB — LIPID PANEL
Chol/HDL Ratio: 4.2 ratio (ref 0.0–5.0)
Cholesterol, Total: 166 mg/dL (ref 100–199)
HDL: 40 mg/dL (ref >39)
LDL Chol Calc (NIH): 87 mg/dL (ref 0–99)
Triglycerides: 229 mg/dL — ABNORMAL HIGH (ref 0–149)
VLDL Cholesterol Cal: 39 mg/dL (ref 5–40)

## 2023-08-10 LAB — CBC
Hematocrit: 39.7 % (ref 37.5–51.0)
Hemoglobin: 12.3 g/dL — ABNORMAL LOW (ref 13.0–17.7)
MCH: 25.2 pg — ABNORMAL LOW (ref 26.6–33.0)
MCHC: 31 g/dL — ABNORMAL LOW (ref 31.5–35.7)
MCV: 81 fL (ref 79–97)
Platelets: 102 10*3/uL — ABNORMAL LOW (ref 150–450)
RBC: 4.89 x10E6/uL (ref 4.14–5.80)
RDW: 17 % — ABNORMAL HIGH (ref 11.6–15.4)
WBC: 6.2 10*3/uL (ref 3.4–10.8)

## 2023-08-10 MED ORDER — EZETIMIBE 10 MG PO TABS
10.0000 mg | ORAL_TABLET | Freq: Every day | ORAL | 3 refills | Status: AC
Start: 2023-08-10 — End: ?

## 2023-08-11 ENCOUNTER — Other Ambulatory Visit (HOSPITAL_BASED_OUTPATIENT_CLINIC_OR_DEPARTMENT_OTHER): Payer: Self-pay | Admitting: *Deleted

## 2023-08-11 DIAGNOSIS — Z79899 Other long term (current) drug therapy: Secondary | ICD-10-CM

## 2023-08-11 DIAGNOSIS — E782 Mixed hyperlipidemia: Secondary | ICD-10-CM

## 2023-11-11 ENCOUNTER — Other Ambulatory Visit: Payer: Self-pay | Admitting: Cardiology

## 2023-11-12 LAB — LIPID PANEL
Chol/HDL Ratio: 3.5 ratio (ref 0.0–5.0)
Cholesterol, Total: 135 mg/dL (ref 100–199)
HDL: 39 mg/dL — ABNORMAL LOW (ref >39)
LDL Chol Calc (NIH): 73 mg/dL (ref 0–99)
Triglycerides: 132 mg/dL (ref 0–149)
VLDL Cholesterol Cal: 23 mg/dL (ref 5–40)

## 2023-11-17 ENCOUNTER — Ambulatory Visit: Payer: Self-pay | Admitting: Cardiology
